# Patient Record
Sex: Female | Born: 1980 | Race: White | Hispanic: No | Marital: Married | Smoking: Never smoker
Health system: Southern US, Community
[De-identification: ages and names within clinical notes are randomized; demographics above are authoritative.]

## PROBLEM LIST (undated history)

## (undated) DIAGNOSIS — N809 Endometriosis, unspecified: Secondary | ICD-10-CM

## (undated) DIAGNOSIS — N3010 Interstitial cystitis (chronic) without hematuria: Secondary | ICD-10-CM

## (undated) DIAGNOSIS — I10 Essential (primary) hypertension: Secondary | ICD-10-CM

## (undated) HISTORY — PX: ABDOMINAL SURGERY: SHX537

## (undated) HISTORY — PX: TONSILLECTOMY: SUR1361

## (undated) HISTORY — DX: Interstitial cystitis (chronic) without hematuria: N30.10

## (undated) HISTORY — DX: Endometriosis, unspecified: N80.9

## (undated) HISTORY — PX: KNEE SURGERY: SHX244

---

## 2002-03-08 ENCOUNTER — Ambulatory Visit (HOSPITAL_COMMUNITY): Admission: RE | Admit: 2002-03-08 | Discharge: 2002-03-08 | Payer: Self-pay | Admitting: Internal Medicine

## 2002-03-08 ENCOUNTER — Encounter: Payer: Self-pay | Admitting: Internal Medicine

## 2002-04-23 ENCOUNTER — Other Ambulatory Visit: Admission: RE | Admit: 2002-04-23 | Discharge: 2002-04-23 | Payer: Self-pay | Admitting: Obstetrics and Gynecology

## 2002-12-22 ENCOUNTER — Emergency Department (HOSPITAL_COMMUNITY): Admission: EM | Admit: 2002-12-22 | Discharge: 2002-12-22 | Payer: Self-pay | Admitting: Emergency Medicine

## 2003-02-14 ENCOUNTER — Other Ambulatory Visit: Admission: RE | Admit: 2003-02-14 | Discharge: 2003-02-14 | Payer: Self-pay | Admitting: Obstetrics and Gynecology

## 2003-10-28 ENCOUNTER — Other Ambulatory Visit: Admission: RE | Admit: 2003-10-28 | Discharge: 2003-10-28 | Payer: Self-pay | Admitting: Obstetrics and Gynecology

## 2009-09-05 ENCOUNTER — Emergency Department (HOSPITAL_BASED_OUTPATIENT_CLINIC_OR_DEPARTMENT_OTHER): Admission: EM | Admit: 2009-09-05 | Discharge: 2009-09-05 | Payer: Self-pay | Admitting: Emergency Medicine

## 2010-11-11 ENCOUNTER — Emergency Department (HOSPITAL_BASED_OUTPATIENT_CLINIC_OR_DEPARTMENT_OTHER)
Admission: EM | Admit: 2010-11-11 | Discharge: 2010-11-11 | Disposition: A | Discharge status: Home / Self Care | Payer: BC Managed Care – PPO | Attending: Emergency Medicine | Admitting: Emergency Medicine

## 2010-11-11 DIAGNOSIS — R109 Unspecified abdominal pain: Secondary | ICD-10-CM | POA: Insufficient documentation

## 2010-11-11 DIAGNOSIS — N83209 Unspecified ovarian cyst, unspecified side: Secondary | ICD-10-CM | POA: Insufficient documentation

## 2010-11-11 DIAGNOSIS — I10 Essential (primary) hypertension: Secondary | ICD-10-CM | POA: Insufficient documentation

## 2010-11-11 LAB — DIFFERENTIAL
Basophils Absolute: 0 10*3/uL (ref 0.0–0.1)
Basophils Relative: 0 % (ref 0–1)
Eosinophils Absolute: 0.1 10*3/uL (ref 0.0–0.7)
Eosinophils Relative: 1 % (ref 0–5)
Lymphocytes Relative: 15 % (ref 12–46)
Lymphs Abs: 1.4 10*3/uL (ref 0.7–4.0)
Monocytes Absolute: 0.7 10*3/uL (ref 0.1–1.0)
Monocytes Relative: 8 % (ref 3–12)
Neutro Abs: 7.3 10*3/uL (ref 1.7–7.7)
Neutrophils Relative %: 77 % (ref 43–77)

## 2010-11-11 LAB — COMPREHENSIVE METABOLIC PANEL
ALT: 23 U/L (ref 0–35)
AST: 25 U/L (ref 0–37)
Albumin: 4.8 g/dL (ref 3.5–5.2)
Alkaline Phosphatase: 57 U/L (ref 39–117)
BUN: 16 mg/dL (ref 6–23)
CO2: 22 mEq/L (ref 19–32)
Calcium: 9.1 mg/dL (ref 8.4–10.5)
Chloride: 106 mEq/L (ref 96–112)
Creatinine, Ser: 0.7 mg/dL (ref 0.4–1.2)
GFR calc Af Amer: >60 mL/min (ref >60)
GFR calc non Af Amer: >60 mL/min (ref >60)
Glucose, Bld: 99 mg/dL (ref 70–99)
Potassium: 3.7 mEq/L (ref 3.5–5.1)
Sodium: 142 mEq/L (ref 135–145)
Total Bilirubin: 0.8 mg/dL (ref 0.3–1.2)
Total Protein: 8 g/dL (ref 6.0–8.3)

## 2010-11-11 LAB — URINALYSIS, ROUTINE W REFLEX MICROSCOPIC
Bilirubin Urine: NEGATIVE
Glucose, UA: NEGATIVE mg/dL
Hgb urine dipstick: NEGATIVE
Ketones, ur: NEGATIVE mg/dL
Nitrite: NEGATIVE
Protein, ur: NEGATIVE mg/dL
Specific Gravity, Urine: 1.012 (ref 1.005–1.030)
Urobilinogen, UA: 0.2 mg/dL (ref 0.0–1.0)
pH: 6.5 (ref 5.0–8.0)

## 2010-11-11 LAB — CBC
HCT: 38.8 % (ref 36.0–46.0)
Hemoglobin: 13.9 g/dL (ref 12.0–15.0)
MCH: 32.6 pg (ref 26.0–34.0)
MCHC: 35.8 g/dL (ref 30.0–36.0)
MCV: 91.1 fL (ref 78.0–100.0)
Platelets: 235 10*3/uL (ref 150–400)
RBC: 4.26 MIL/uL (ref 3.87–5.11)
RDW: 12.2 % (ref 11.5–15.5)
WBC: 9.5 10*3/uL (ref 4.0–10.5)

## 2010-11-11 LAB — PREGNANCY, URINE: Preg Test, Ur: POSITIVE

## 2010-11-14 LAB — URINE MICROSCOPIC-ADD ON

## 2010-11-14 LAB — URINALYSIS, ROUTINE W REFLEX MICROSCOPIC
Bilirubin Urine: NEGATIVE
Glucose, UA: NEGATIVE mg/dL
Ketones, ur: NEGATIVE mg/dL
Leukocytes, UA: NEGATIVE
Nitrite: NEGATIVE
Protein, ur: NEGATIVE mg/dL
Specific Gravity, Urine: 1.017 (ref 1.005–1.030)
Urobilinogen, UA: 1 mg/dL (ref 0.0–1.0)
pH: 7 (ref 5.0–8.0)

## 2010-11-14 LAB — COMPREHENSIVE METABOLIC PANEL
ALT: 17 U/L (ref 0–35)
AST: 22 U/L (ref 0–37)
Albumin: 4.6 g/dL (ref 3.5–5.2)
Alkaline Phosphatase: 70 U/L (ref 39–117)
BUN: 18 mg/dL (ref 6–23)
CO2: 25 mEq/L (ref 19–32)
Calcium: 9 mg/dL (ref 8.4–10.5)
Chloride: 106 mEq/L (ref 96–112)
Creatinine, Ser: 0.7 mg/dL (ref 0.4–1.2)
GFR calc Af Amer: >60 mL/min (ref >60)
GFR calc non Af Amer: >60 mL/min (ref >60)
Glucose, Bld: 98 mg/dL (ref 70–99)
Potassium: 3.4 mEq/L — ABNORMAL LOW (ref 3.5–5.1)
Sodium: 142 mEq/L (ref 135–145)
Total Bilirubin: 0.8 mg/dL (ref 0.3–1.2)
Total Protein: 7.7 g/dL (ref 6.0–8.3)

## 2010-11-14 LAB — DIFFERENTIAL
Basophils Absolute: 0.1 10*3/uL (ref 0.0–0.1)
Basophils Relative: 1 % (ref 0–1)
Eosinophils Absolute: 0 10*3/uL (ref 0.0–0.7)
Eosinophils Relative: 0 % (ref 0–5)
Lymphocytes Relative: 4 % — ABNORMAL LOW (ref 12–46)
Lymphs Abs: 0.4 10*3/uL — ABNORMAL LOW (ref 0.7–4.0)
Monocytes Absolute: 0.3 10*3/uL (ref 0.1–1.0)
Monocytes Relative: 3 % (ref 3–12)
Neutro Abs: 9.9 10*3/uL — ABNORMAL HIGH (ref 1.7–7.7)
Neutrophils Relative %: 93 % — ABNORMAL HIGH (ref 43–77)

## 2010-11-14 LAB — CBC
HCT: 44.7 % (ref 36.0–46.0)
Hemoglobin: 15.5 g/dL — ABNORMAL HIGH (ref 12.0–15.0)
MCHC: 34.7 g/dL (ref 30.0–36.0)
MCV: 95.6 fL (ref 78.0–100.0)
Platelets: 220 10*3/uL (ref 150–400)
RBC: 4.68 MIL/uL (ref 3.87–5.11)
RDW: 12.2 % (ref 11.5–15.5)
WBC: 10.7 10*3/uL — ABNORMAL HIGH (ref 4.0–10.5)

## 2010-11-14 LAB — PREGNANCY, URINE: Preg Test, Ur: NEGATIVE

## 2010-11-14 LAB — LIPASE, BLOOD: Lipase: 42 U/L (ref 23–300)

## 2010-12-01 ENCOUNTER — Emergency Department (HOSPITAL_BASED_OUTPATIENT_CLINIC_OR_DEPARTMENT_OTHER)
Admission: EM | Admit: 2010-12-01 | Discharge: 2010-12-01 | Disposition: A | Discharge status: Other Acute Inpatient Hospital | Payer: BC Managed Care – PPO | Attending: Emergency Medicine | Admitting: Emergency Medicine

## 2010-12-01 DIAGNOSIS — O2690 Pregnancy related conditions, unspecified, unspecified trimester: Secondary | ICD-10-CM | POA: Insufficient documentation

## 2010-12-01 DIAGNOSIS — R1031 Right lower quadrant pain: Secondary | ICD-10-CM | POA: Insufficient documentation

## 2010-12-01 DIAGNOSIS — I10 Essential (primary) hypertension: Secondary | ICD-10-CM | POA: Insufficient documentation

## 2010-12-01 LAB — URINALYSIS, ROUTINE W REFLEX MICROSCOPIC
Bilirubin Urine: NEGATIVE
Glucose, UA: NEGATIVE mg/dL
Hgb urine dipstick: NEGATIVE
Ketones, ur: NEGATIVE mg/dL
Nitrite: NEGATIVE
Protein, ur: NEGATIVE mg/dL
Specific Gravity, Urine: 1.014 (ref 1.005–1.030)
Urobilinogen, UA: 0.2 mg/dL (ref 0.0–1.0)
pH: 7 (ref 5.0–8.0)

## 2011-04-27 ENCOUNTER — Other Ambulatory Visit (HOSPITAL_COMMUNITY): Payer: Self-pay | Admitting: Obstetrics and Gynecology

## 2011-04-27 DIAGNOSIS — N979 Female infertility, unspecified: Secondary | ICD-10-CM

## 2011-04-29 ENCOUNTER — Ambulatory Visit (HOSPITAL_COMMUNITY)
Admission: RE | Admit: 2011-04-29 | Discharge: 2011-04-29 | Disposition: A | Discharge status: Home / Self Care | Payer: BC Managed Care – PPO | Source: Ambulatory Visit | Attending: Obstetrics and Gynecology | Admitting: Obstetrics and Gynecology

## 2011-04-29 DIAGNOSIS — O00109 Unspecified tubal pregnancy without intrauterine pregnancy: Secondary | ICD-10-CM

## 2011-04-29 DIAGNOSIS — N979 Female infertility, unspecified: Secondary | ICD-10-CM

## 2011-04-29 DIAGNOSIS — N96 Recurrent pregnancy loss: Secondary | ICD-10-CM | POA: Insufficient documentation

## 2011-04-29 MED ORDER — IOHEXOL 300 MG/ML  SOLN
8.0000 mL | Freq: Once | INTRAMUSCULAR | Status: AC | PRN
Start: 2011-04-29 — End: 2011-04-29
  Administered 2011-04-29: 8 mL

## 2011-07-19 DIAGNOSIS — I10 Essential (primary) hypertension: Secondary | ICD-10-CM | POA: Insufficient documentation

## 2011-12-16 DIAGNOSIS — N979 Female infertility, unspecified: Secondary | ICD-10-CM | POA: Insufficient documentation

## 2013-02-19 ENCOUNTER — Encounter (HOSPITAL_BASED_OUTPATIENT_CLINIC_OR_DEPARTMENT_OTHER): Payer: Self-pay | Admitting: *Deleted

## 2013-02-19 ENCOUNTER — Emergency Department (HOSPITAL_BASED_OUTPATIENT_CLINIC_OR_DEPARTMENT_OTHER)
Admission: EM | Admit: 2013-02-19 | Discharge: 2013-02-19 | Disposition: A | Discharge status: Home / Self Care | Payer: BC Managed Care – PPO | Attending: Emergency Medicine | Admitting: Emergency Medicine

## 2013-02-19 DIAGNOSIS — L299 Pruritus, unspecified: Secondary | ICD-10-CM | POA: Insufficient documentation

## 2013-02-19 DIAGNOSIS — L272 Dermatitis due to ingested food: Secondary | ICD-10-CM

## 2013-02-19 DIAGNOSIS — R21 Rash and other nonspecific skin eruption: Secondary | ICD-10-CM | POA: Insufficient documentation

## 2013-02-19 DIAGNOSIS — Z79899 Other long term (current) drug therapy: Secondary | ICD-10-CM | POA: Insufficient documentation

## 2013-02-19 DIAGNOSIS — Y9389 Activity, other specified: Secondary | ICD-10-CM | POA: Insufficient documentation

## 2013-02-19 DIAGNOSIS — T61771A Other fish poisoning, accidental (unintentional), initial encounter: Secondary | ICD-10-CM | POA: Insufficient documentation

## 2013-02-19 DIAGNOSIS — I10 Essential (primary) hypertension: Secondary | ICD-10-CM | POA: Insufficient documentation

## 2013-02-19 DIAGNOSIS — Y9289 Other specified places as the place of occurrence of the external cause: Secondary | ICD-10-CM | POA: Insufficient documentation

## 2013-02-19 HISTORY — DX: Essential (primary) hypertension: I10

## 2013-02-19 MED ORDER — FAMOTIDINE IN NACL 20-0.9 MG/50ML-% IV SOLN
20.0000 mg | Freq: Once | INTRAVENOUS | Status: AC
  Administered 2013-02-19: 20 mg via INTRAVENOUS
  Filled 2013-02-19: qty 50

## 2013-02-19 MED ORDER — DIPHENHYDRAMINE HCL 50 MG/ML IJ SOLN
25.0000 mg | Freq: Once | INTRAMUSCULAR | Status: AC
  Administered 2013-02-19: 25 mg via INTRAVENOUS
  Filled 2013-02-19: qty 1

## 2013-02-19 MED ORDER — EPINEPHRINE 0.3 MG/0.3ML IJ SOAJ: 0.3000 mg | INTRAMUSCULAR | Status: DC | PRN

## 2013-02-19 MED ORDER — ACETAMINOPHEN 325 MG PO TABS
650.0000 mg | ORAL_TABLET | Freq: Once | ORAL | Status: AC
  Administered 2013-02-19: 650 mg via ORAL
  Filled 2013-02-19: qty 2

## 2013-02-19 MED ORDER — SODIUM CHLORIDE 0.9 % IV SOLN
Freq: Once | INTRAVENOUS | Status: AC
  Administered 2013-02-19: 1000 mL via INTRAVENOUS

## 2013-02-19 MED ORDER — METHYLPREDNISOLONE SODIUM SUCC 125 MG IJ SOLR
125.0000 mg | Freq: Once | INTRAMUSCULAR | Status: AC
  Administered 2013-02-19: 125 mg via INTRAVENOUS
  Filled 2013-02-19: qty 2

## 2013-02-19 NOTE — ED Provider Notes (Signed)
Medical screening examination/treatment/procedure(s) were performed by non-physician practitioner and as supervising physician I was immediately available for consultation/collaboration.   Ebunoluwa Gernert, MD 02/19/13 2322 

## 2013-02-19 NOTE — ED Notes (Signed)
MD at bedside. 

## 2013-02-19 NOTE — ED Notes (Signed)
Pt was eating tuna at a family member's that had unknown spices and she had a sudden onset of HA, skin flushing, chest tightness and sob. Pt denies difficulty swallowing.

## 2013-02-19 NOTE — ED Notes (Signed)
Redness on skin already resolving, only noticeable on trunk at this time.  Face completely cleared.

## 2013-02-19 NOTE — ED Provider Notes (Signed)
History    CSN: [id number] Arrival date & time 02/19/13  2014  None    Chief Complaint  Patient presents with  . Allergic Reaction   (Consider location/radiation/quality/duration/timing/severity/associated sxs/prior Treatment) Patient is a 32 y.o. female presenting with allergic reaction. The history is provided by the patient. No language interpreter was used.  Allergic Reaction Presenting symptoms: itching and rash   Presenting symptoms: no difficulty breathing, no difficulty swallowing and no swelling   Itching:    Location:  Full body   Severity:  Moderate Rash:    Location:  Full body Severity:  Moderate Prior allergic episodes:  No prior episodes Context: food   Relieved by:  Nothing Worsened by:  Nothing tried Ineffective treatments:  None tried Pt ate tuna with sesame seeds and spices.   Pt reports skin flushing, redness,  Pt reports mouth felt tingly Past Medical History  Diagnosis Date  . Hypertension    Past Surgical History  Procedure Laterality Date  . Abdominal surgery    . Tonsillectomy     No family history on file. History  Substance Use Topics  . Smoking status: Never Smoker   . Smokeless tobacco: Not on file  . Alcohol Use: Yes   OB History   Grav Para Term Preterm Abortions TAB SAB Ect Mult Living                 Review of Systems  HENT: Negative for trouble swallowing.   Skin: Positive for itching and rash.  All other systems reviewed and are negative.    Allergies  Erythromycin  Home Medications   Current Outpatient Rx  Name  Route  Sig  Dispense  Refill  . amLODipine (NORVASC) 2.5 MG tablet   Oral   Take 2.5 mg by mouth 2 (two) times daily.         . carvedilol (COREG) 3.125 MG tablet   Oral   Take 3.125 mg by mouth 2 (two) times daily with a meal.         . methyldopa (ALDOMET) 250 MG tablet   Oral   Take 250 mg by mouth 2 (two) times daily.          BP 111/57  Pulse 115  Temp(Src) 97.3 F (36.3 C) (Oral)   Resp 18  Ht 5\' 7"  (1.702 m)  Wt 135 lb (61.236 kg)  BMI 21.14 kg/m2  SpO2 100%  LMP 01/23/2013 Physical Exam  Nursing note and vitals reviewed. Constitutional: She appears well-developed and well-nourished.  HENT:  Head: Normocephalic and atraumatic.  Eyes: Conjunctivae and EOM are normal. Pupils are equal, round, and reactive to light.  Neck: Normal range of motion. Neck supple.  Cardiovascular: Normal rate and normal heart sounds.   Pulmonary/Chest: Effort normal and breath sounds normal.  Abdominal: Soft. Bowel sounds are normal.  Musculoskeletal: Normal range of motion.  Neurological: She is alert.  Skin: Rash noted. There is erythema.  Psychiatric: She has a normal mood and affect.    ED Course  Procedures (including critical care time) Labs Reviewed - No data to display No results found. 1. Dermatitis due to allergic reaction to food     MDM  Iv ns pepcid, solumedrol, benadryl.     Pt given rx for epipen.   Pt advised to see Dr.  Callas for allergy testing.   Strict avoidance of seafood, sesame Pt reevaluated,  No rash,  No shortness of breath.   Pt feels much better.  Elson Areas, PA-C 02/19/13 2223  Elson Areas, PA-C 02/19/13 2234  Lonia [place], PA-C 02/19/13 2235

## 2013-08-21 ENCOUNTER — Emergency Department (HOSPITAL_BASED_OUTPATIENT_CLINIC_OR_DEPARTMENT_OTHER): Payer: BC Managed Care – PPO

## 2013-08-21 ENCOUNTER — Encounter (HOSPITAL_BASED_OUTPATIENT_CLINIC_OR_DEPARTMENT_OTHER): Payer: Self-pay | Admitting: Emergency Medicine

## 2013-08-21 ENCOUNTER — Emergency Department (HOSPITAL_BASED_OUTPATIENT_CLINIC_OR_DEPARTMENT_OTHER)
Admission: EM | Admit: 2013-08-21 | Discharge: 2013-08-21 | Disposition: A | Discharge status: Home / Self Care | Payer: BC Managed Care – PPO | Attending: Emergency Medicine | Admitting: Emergency Medicine

## 2013-08-21 DIAGNOSIS — S93401A Sprain of unspecified ligament of right ankle, initial encounter: Secondary | ICD-10-CM

## 2013-08-21 DIAGNOSIS — S93409A Sprain of unspecified ligament of unspecified ankle, initial encounter: Secondary | ICD-10-CM | POA: Insufficient documentation

## 2013-08-21 DIAGNOSIS — Y929 Unspecified place or not applicable: Secondary | ICD-10-CM | POA: Insufficient documentation

## 2013-08-21 DIAGNOSIS — Z79899 Other long term (current) drug therapy: Secondary | ICD-10-CM | POA: Insufficient documentation

## 2013-08-21 DIAGNOSIS — Y9301 Activity, walking, marching and hiking: Secondary | ICD-10-CM | POA: Insufficient documentation

## 2013-08-21 DIAGNOSIS — X500XXA Overexertion from strenuous movement or load, initial encounter: Secondary | ICD-10-CM | POA: Insufficient documentation

## 2013-08-21 DIAGNOSIS — I10 Essential (primary) hypertension: Secondary | ICD-10-CM | POA: Insufficient documentation

## 2013-08-21 MED ORDER — HYDROCODONE-ACETAMINOPHEN 5-325 MG PO TABS: 2.0000 | ORAL_TABLET | ORAL | Status: DC | PRN

## 2013-08-21 NOTE — ED Notes (Signed)
Right ankle injury.  Pt stumbled and twisted ankle.  Good pulse, movement and color.

## 2013-08-21 NOTE — ED Provider Notes (Signed)
Medical screening examination/treatment/procedure(s) were performed by non-physician practitioner and as supervising physician I was immediately available for consultation/collaboration.  EKG Interpretation   None        Doug Sou, MD 08/21/13 [phone]

## 2013-08-21 NOTE — ED Provider Notes (Signed)
CSN: [id number]     Arrival date & time 08/21/13  1327 History   None    Chief Complaint  Patient presents with  . Ankle Injury   (Consider location/radiation/quality/duration/timing/severity/associated sxs/prior Treatment) Patient is a 32 y.o. female presenting with ankle pain. The history is provided by the patient. No language interpreter was used.  Ankle Pain Location:  Ankle Injury: yes   Ankle location:  R ankle Pain details:    Quality:  Aching   Radiates to:  Does not radiate   Severity:  Moderate   Timing:  Constant   Progression:  Worsening Chronicity:  New Foreign body present:  No foreign bodies Prior injury to area:  No Ineffective treatments:  None tried Pt turned her ankle while walking.   Pt complains of swelling and pain  Past Medical History  Diagnosis Date  . Hypertension    Past Surgical History  Procedure Laterality Date  . Abdominal surgery    . Tonsillectomy    . Knee surgery     No family history on file. History  Substance Use Topics  . Smoking status: Never Smoker   . Smokeless tobacco: Not on file  . Alcohol Use: Yes   OB History   Grav Para Term Preterm Abortions TAB SAB Ect Mult Living                 Review of Systems  Musculoskeletal: Positive for joint swelling and myalgias.  All other systems reviewed and are negative.    Allergies  Erythromycin  Home Medications   Current Outpatient Rx  Name  Route  Sig  Dispense  Refill  . gabapentin (NEURONTIN) 300 MG capsule   Oral   Take 300 mg by mouth 3 (three) times daily.         Marland Kitchen amLODipine (NORVASC) 2.5 MG tablet   Oral   Take 2.5 mg by mouth 2 (two) times daily.         . carvedilol (COREG) 3.125 MG tablet   Oral   Take 3.125 mg by mouth 2 (two) times daily with a meal.         . EPINEPHrine (EPIPEN) 0.3 mg/0.3 mL DEVI   Intramuscular   Inject 0.3 mLs (0.3 mg total) into the muscle as needed.   1 Device   1   . methyldopa (ALDOMET) 250 MG tablet    Oral   Take 250 mg by mouth 2 (two) times daily.          BP 133/81  Pulse 61  Temp(Src) 98.5 F (36.9 C) (Oral)  Resp 16  Ht 5\' 7"  (1.702 m)  Wt 140 lb (63.504 kg)  BMI 21.92 kg/m2  SpO2 100%  LMP 08/09/2013 Physical Exam  Nursing note and vitals reviewed. Constitutional: She is oriented to person, place, and time. She appears well-developed and well-nourished.  HENT:  Head: Normocephalic and atraumatic.  Musculoskeletal: She exhibits tenderness.  Swollen tender right ankle, decreased range of motion,  nv and ns intact   Neurological: She is alert and oriented to person, place, and time. She has normal reflexes.  Skin: Skin is warm.  Psychiatric: She has a normal mood and affect.    ED Course  Procedures (including critical care time) Labs Review Labs Reviewed - No data to display Imaging Review Dg Ankle Complete Right  08/21/2013   CLINICAL DATA:  Ankle gave out while walking down stairs, pain all over, swelling medially and laterally  EXAM: RIGHT ANKLE -  COMPLETE 3+ VIEW  COMPARISON:  None  FINDINGS: Scattered soft tissue swelling most prominent laterally.  Osseous mineralization normal.  Ankle mortise intact.  No acute fracture, dislocation, or bone destruction.  IMPRESSION: No acute osseous abnormalities.   Electronically Signed   By: Ulyses Southward M.D.   On: 08/21/2013 13:49    EKG Interpretation   None       MDM   1. Right ankle sprain, initial encounter    [place], crutches and hydrocodone,  Follow up with Dr. Pearletha Forge for recheck in 1 week    Elson Areas, PA-C 08/21/13 1520

## 2013-08-21 NOTE — ED Notes (Signed)
PA at bedside.

## 2015-09-04 NOTE — Patient Instructions (Addendum)
Your procedure is scheduled on:  Thursday, Jan. 12, 2017  Enter through the Main Entrance of [facility] at:  6:00 A.M.  Pick up the phone at the desk and dial [phone].  Call this number if you have problems the morning of surgery: [phone].  Remember: Do NOT eat food or drink after:  Midnight Wednesday Take these medicines the morning of surgery with a SIP OF WATER:  Amlodipine, Carvedilol,  Methyldopa  *Bring Asthma inhaler day of surgery  Do NOT wear jewelry (body piercing), metal hair clips/bobby pins, make-up, or nail polish. Do NOT wear lotions, powders, or perfumes.  You may wear deoderant. Do NOT shave for 48 hours prior to surgery. Do NOT bring valuables to the hospital. Contacts, dentures, or bridgework may not be worn into surgery.  Have a responsible adult drive you home and stay with you for 24 hours after your procedure

## 2015-09-07 ENCOUNTER — Inpatient Hospital Stay (HOSPITAL_COMMUNITY)
Admission: RE | Admit: 2015-09-07 | Discharge: 2015-09-07 | Disposition: A | Discharge status: Home / Self Care | Payer: Self-pay | Source: Ambulatory Visit

## 2015-09-10 ENCOUNTER — Encounter (HOSPITAL_COMMUNITY): Admission: RE | Payer: Self-pay | Source: Ambulatory Visit

## 2015-09-10 ENCOUNTER — Ambulatory Visit (HOSPITAL_COMMUNITY)
Admission: RE | Admit: 2015-09-10 | Payer: BC Managed Care – PPO | Source: Ambulatory Visit | Admitting: Obstetrics and Gynecology

## 2015-09-10 SURGERY — LAPAROTOMY, FOR LYSIS OF ADHESIONS
Anesthesia: General | Laterality: Right

## 2015-09-11 DIAGNOSIS — J45909 Unspecified asthma, uncomplicated: Secondary | ICD-10-CM | POA: Insufficient documentation

## 2015-10-13 DIAGNOSIS — R768 Other specified abnormal immunological findings in serum: Secondary | ICD-10-CM | POA: Insufficient documentation

## 2015-10-13 DIAGNOSIS — R7689 Other specified abnormal immunological findings in serum: Secondary | ICD-10-CM | POA: Insufficient documentation

## 2016-06-14 DIAGNOSIS — N736 Female pelvic peritoneal adhesions (postinfective): Secondary | ICD-10-CM | POA: Insufficient documentation

## 2016-06-14 DIAGNOSIS — N809 Endometriosis, unspecified: Secondary | ICD-10-CM | POA: Insufficient documentation

## 2016-08-29 DIAGNOSIS — N3010 Interstitial cystitis (chronic) without hematuria: Secondary | ICD-10-CM

## 2016-08-29 HISTORY — DX: Interstitial cystitis (chronic) without hematuria: N30.10

## 2017-06-22 DIAGNOSIS — R3982 Chronic bladder pain: Secondary | ICD-10-CM | POA: Insufficient documentation

## 2017-09-22 DIAGNOSIS — M533 Sacrococcygeal disorders, not elsewhere classified: Secondary | ICD-10-CM | POA: Insufficient documentation

## 2018-04-27 ENCOUNTER — Emergency Department (HOSPITAL_BASED_OUTPATIENT_CLINIC_OR_DEPARTMENT_OTHER)
Admission: EM | Admit: 2018-04-27 | Discharge: 2018-04-27 | Disposition: A | Discharge status: Home / Self Care | Payer: No Typology Code available for payment source | Attending: Emergency Medicine | Admitting: Emergency Medicine

## 2018-04-27 ENCOUNTER — Other Ambulatory Visit: Payer: Self-pay

## 2018-04-27 ENCOUNTER — Emergency Department (HOSPITAL_BASED_OUTPATIENT_CLINIC_OR_DEPARTMENT_OTHER): Payer: No Typology Code available for payment source

## 2018-04-27 ENCOUNTER — Encounter (HOSPITAL_BASED_OUTPATIENT_CLINIC_OR_DEPARTMENT_OTHER): Payer: Self-pay

## 2018-04-27 DIAGNOSIS — I10 Essential (primary) hypertension: Secondary | ICD-10-CM | POA: Insufficient documentation

## 2018-04-27 DIAGNOSIS — Z79899 Other long term (current) drug therapy: Secondary | ICD-10-CM | POA: Insufficient documentation

## 2018-04-27 DIAGNOSIS — S67192A Crushing injury of right middle finger, initial encounter: Secondary | ICD-10-CM | POA: Insufficient documentation

## 2018-04-27 DIAGNOSIS — S67190A Crushing injury of right index finger, initial encounter: Secondary | ICD-10-CM | POA: Diagnosis not present

## 2018-04-27 DIAGNOSIS — S6710XA Crushing injury of unspecified finger(s), initial encounter: Secondary | ICD-10-CM

## 2018-04-27 DIAGNOSIS — Y929 Unspecified place or not applicable: Secondary | ICD-10-CM | POA: Diagnosis not present

## 2018-04-27 DIAGNOSIS — T1490XA Injury, unspecified, initial encounter: Secondary | ICD-10-CM

## 2018-04-27 DIAGNOSIS — W231XXA Caught, crushed, jammed, or pinched between stationary objects, initial encounter: Secondary | ICD-10-CM | POA: Insufficient documentation

## 2018-04-27 DIAGNOSIS — Y990 Civilian activity done for income or pay: Secondary | ICD-10-CM | POA: Diagnosis not present

## 2018-04-27 DIAGNOSIS — Y939 Activity, unspecified: Secondary | ICD-10-CM | POA: Diagnosis not present

## 2018-04-27 NOTE — Discharge Instructions (Addendum)
You were evaluated in the emergency department for crush injuries to your right index and middle finger.  Your x-rays did not show an obvious fracture.  This will usually improve with ice Tylenol ibuprofen as needed.  Please follow-up with your doctor if any concerns.

## 2018-04-27 NOTE — ED Provider Notes (Signed)
[facility] EMERGENCY DEPARTMENT Provider Note   CSN: [id number] Arrival date & time: 04/27/18  1547     History   Chief Complaint Chief Complaint  Patient presents with  . Finger Injury    HPI Rebekah Hanson is a 37 y.o. female.  She is right-hand dominant.  She was injured at work with a crush to her right index and middle finger when she got heavy cart pinned up against a door.  This happened earlier today and she has been icing them with some relief pain and swelling.  There was no bleeding.  She is got no numbness.  Denies other injury or complaints.  The history is provided by the patient.  Hand Pain  This is a new problem. The current episode started 3 to 5 hours ago. The problem occurs constantly. The problem has been gradually improving. Pertinent negatives include no chest pain, no abdominal pain, no headaches and no shortness of breath. The symptoms are aggravated by bending. The symptoms are relieved by ice. She has tried a cold compress for the symptoms. The treatment provided moderate relief.    Past Medical History:  Diagnosis Date  . Hypertension     There are no active problems to display for this patient.   Past Surgical History:  Procedure Laterality Date  . ABDOMINAL SURGERY    . KNEE SURGERY    . TONSILLECTOMY       OB History   None      Home Medications    Prior to Admission medications   Medication Sig Start Date End Date Taking? Authorizing Provider  acetaminophen (TYLENOL ARTHRITIS PAIN) 650 MG CR tablet Take 1,300 mg by mouth every 8 (eight) hours as needed for pain.    [provider]  albuterol (PROVENTIL HFA;VENTOLIN HFA) 108 (90 Base) MCG/ACT inhaler Inhale 2 puffs into the lungs every 6 (six) hours as needed for wheezing or shortness of breath.    [provider]  amLODipine (NORVASC) 2.5 MG tablet Take 2.5 mg by mouth 2 (two) times daily.    [provider]  carvedilol (COREG) 3.125 MG  tablet Take 3.125 mg by mouth 2 (two) times daily with a meal.    [provider]  EPINEPHrine (EPIPEN) 0.3 mg/0.3 mL DEVI Inject 0.3 mLs (0.3 mg total) into the muscle as needed. 02/19/13   Elson Areas, PA-C  Fluticasone Furoate-Vilanterol (BREO ELLIPTA) 100-25 MCG/INH AEPB Inhale 1 puff into the lungs daily.    [provider]  guaiFENesin (MUCINEX) 600 MG 12 hr tablet Take 600 mg by mouth daily as needed for cough or to loosen phlegm.    [provider]  methyldopa (ALDOMET) 250 MG tablet Take 250 mg by mouth 2 (two) times daily.    [provider]  montelukast (SINGULAIR) 10 MG tablet Take 10 mg by mouth at bedtime.    [provider]  naproxen sodium (ANAPROX) 220 MG tablet Take 440 mg by mouth 2 (two) times daily with a meal.     [provider]    Family History No family history on file.  Social History Social History   Tobacco Use  . Smoking status: Never Smoker  Substance Use Topics  . Alcohol use: Yes  . Drug use: Not on file     Allergies   Amoxicillin and Erythromycin   Review of Systems Review of Systems  Constitutional: Negative for fever.  HENT: Negative for sore throat.   Eyes: Negative  for visual disturbance.  Respiratory: Negative for shortness of breath.   Cardiovascular: Negative for chest pain.  Gastrointestinal: Negative for abdominal pain.  Genitourinary: Negative for dysuria.  Musculoskeletal: Negative for back pain.  Skin: Negative for rash and wound.  Neurological: Negative for headaches.     Physical Exam Updated Vital Signs BP (!) 134/94 (BP Location: Left Arm)   Pulse 78   Temp 98.5 F (36.9 C) (Oral)   Resp 16   Ht 5\' 7"  (1.702 m)   Wt 63.5 kg   LMP 04/20/2018   SpO2 100%   BMI 21.93 kg/m   Physical Exam  Constitutional: She appears well-developed and well-nourished.  HENT:  Head: Normocephalic and atraumatic.  Eyes: Conjunctivae are normal.  Neck: Neck supple.    Pulmonary/Chest: Effort normal.  Musculoskeletal: She exhibits tenderness. She exhibits no deformity.  She is some erythema and some tenderness over her right index at the dIP and her right middle at the DIP.  Nails are intact and there is no subungual hematoma.  Sensation intact to light touch.  No skin breaks.  Other digits unaffected full range of motion.  Neurological: She is alert. GCS eye subscore is 4. GCS verbal subscore is 5. GCS motor subscore is 6.  Skin: Skin is warm and dry. Capillary refill takes less than 2 seconds.  Psychiatric: She has a normal mood and affect.     ED Treatments / Results  Labs (all labs ordered are listed, but only abnormal results are displayed) Labs Reviewed - No data to display  EKG None  Radiology Dg Hand Complete Right  Result Date: 04/27/2018 CLINICAL DATA:  Second third digit pain after hand was caught between a book cart and door frame. EXAM: RIGHT HAND - COMPLETE 3+ VIEW COMPARISON:  None. FINDINGS: There is no evidence of fracture or dislocation. There is no evidence of arthropathy or other focal bone abnormality. Soft tissues are unremarkable. IMPRESSION: Negative. Electronically Signed   By: Tollie Ethavid  Kwon M.D.   On: 04/27/2018 16:51    Procedures Procedures (including critical care time)  Medications Ordered in ED Medications - No data to display   Initial Impression / Assessment and Plan / ED Course  I have reviewed the triage vital signs and the nursing notes.  Pertinent labs & imaging results that were available during my care of the patient were reviewed by me and considered in my medical decision making (see chart for details).       Final Clinical Impressions(s) / ED Diagnoses   Final diagnoses:  Crushing injury of finger, initial encounter    ED Discharge Orders    None       Terrilee FilesButler, Billy Turvey C, MD 04/28/18 [phone]

## 2018-04-27 NOTE — ED Triage Notes (Addendum)
Pt got her right index and right middle fingers stuck between a wall and a heavy metal cart while moving some books at work, no UDS needed, no break in skin, pt has ice pack in place

## 2019-01-04 DIAGNOSIS — S92919A Unspecified fracture of unspecified toe(s), initial encounter for closed fracture: Secondary | ICD-10-CM | POA: Insufficient documentation

## 2019-05-10 DIAGNOSIS — Z8249 Family history of ischemic heart disease and other diseases of the circulatory system: Secondary | ICD-10-CM | POA: Insufficient documentation

## 2019-10-24 ENCOUNTER — Ambulatory Visit: Payer: BC Managed Care – PPO | Attending: Internal Medicine

## 2019-10-24 DIAGNOSIS — Z23 Encounter for immunization: Secondary | ICD-10-CM

## 2019-10-24 NOTE — Progress Notes (Signed)
   Covid-19 Vaccination Clinic  Name:  JAMYE BALICKI    MRN: [id number] DOB: 05-21-1981  10/24/2019  Ms. Mazzeo was observed post Covid-19 immunization for 15 minutes without incidence. She was provided with Vaccine Information Sheet and instruction to access the V-Safe system.   Ms. Landry was instructed to call 911 with any severe reactions post vaccine: Marland Kitchen Difficulty breathing  . Swelling of your face and throat  . A fast heartbeat  . A bad rash all over your body  . Dizziness and weakness    Immunizations Administered    Name Date Dose VIS Date Route   Pfizer COVID-19 Vaccine 10/24/2019  8:41 AM 0.3 mL 08/09/2019 Intramuscular   Manufacturer: [employer], [employer]   Lot: [id number]   NDC: [address]-9

## 2019-11-13 ENCOUNTER — Ambulatory Visit: Payer: BC Managed Care – PPO | Attending: Internal Medicine

## 2019-11-13 DIAGNOSIS — Z23 Encounter for immunization: Secondary | ICD-10-CM

## 2019-11-13 NOTE — Progress Notes (Signed)
   Covid-19 Vaccination Clinic  Name:  KAMIKA GOODLOE    MRN: [id number] DOB: July 25, 1981  11/13/2019  Ms. Spellman was observed post Covid-19 immunization for 15 minutes without incident. She was provided with Vaccine Information Sheet and instruction to access the V-Safe system.   Ms. Hollinger was instructed to call 911 with any severe reactions post vaccine: Marland Kitchen Difficulty breathing  . Swelling of face and throat  . A fast heartbeat  . A bad rash all over body  . Dizziness and weakness   Immunizations Administered    Name Date Dose VIS Date Route   Pfizer COVID-19 Vaccine 11/13/2019 11:11 AM 0.3 mL 08/09/2019 Intramuscular   Manufacturer: [employer], [employer]   Lot: [id number]   NDC: [address]-8

## 2019-12-20 ENCOUNTER — Other Ambulatory Visit: Payer: Self-pay

## 2019-12-20 ENCOUNTER — Ambulatory Visit (INDEPENDENT_AMBULATORY_CARE_PROVIDER_SITE_OTHER): Payer: BC Managed Care – PPO | Admitting: Family Medicine

## 2019-12-20 ENCOUNTER — Encounter: Payer: Self-pay | Admitting: Family Medicine

## 2019-12-20 DIAGNOSIS — G8929 Other chronic pain: Secondary | ICD-10-CM | POA: Diagnosis not present

## 2019-12-20 DIAGNOSIS — M545 Low back pain, unspecified: Secondary | ICD-10-CM

## 2019-12-20 DIAGNOSIS — M25552 Pain in left hip: Secondary | ICD-10-CM

## 2019-12-20 NOTE — Progress Notes (Signed)
Office Visit Note   Patient: Rebekah Hanson           Date of Birth: 11/22/1980           MRN: [id number] Visit Date: 12/20/2019 Requested by: Brooke Bonito, MD No address on file PCP: Brooke Bonito, MD  Subjective: Chief Complaint  Patient presents with  . Lower Back - Pain    Pain posterior hip/buttock, radiating down the posterior leg to bottom of foot - x 7 years. This started after vaginal birth of twins. Been seeing [place] for PT - recommended she come here. H/o chiro, ESIs, SI injection & piriformis injection.    HPI: She is here at the request of [place]mm[place] for left low back and hip pain.  7 years ago she gave birth to twin boys.  It was a high risk vaginal delivery so it was done in the operating room under anesthesia in anticipation of possible C-section.  She was not able to move or feel her legs during the deliveries and they had to help lift her so that she could push.  She did not notice any problems during the deliveries, but as soon as she was able to start walking after the anesthesia wore off, she felt immediate pain in the left posterior hip radiating down the left leg.  The pain continued over the next several weeks causing her to walk with a limp.  She began seeing a chiropractor but the treatments worked making the pain go away.  She then started working with [place] for physical therapy.  Therapy sessions helped, but only temporarily.  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  She had epidural steroid injection and sacroiliac injection but neither one helped.  2 years ago she went to [facility] and had an MRI of the pelvis which was unrevealing.  She was given a piriformis injection which helped for a couple months.  Her pain then came back and she has been dealing with it with physical therapy treatments.  Last year she became  pregnant with her daughter and during the second trimester, she became pain-free for some reason.  She had no troubles at all for the rest of her pregnancy until about 2 or 3 weeks after her C-section.  Now she is having daily pain with intermittent flareups as before.  She is very frustrated by her ongoing symptoms.  She is still working with [place] for physical therapy.  She is a former Personnel officer.  She has never had problems like this before.              ROS: No bowel or bladder dysfunction.  All other systems were reviewed and are negative.  Objective: Vital Signs: There were no vitals taken for this visit.  Physical Exam:  General:  Alert and oriented, in no acute distress. Pulm:  Breathing unlabored. Psy:  Normal mood, congruent affect.  Low back: She is tender to palpation near the SI joint on the left.  She is also tender in the gluteus medius region and along the piriformis muscle.  Straight leg raise is negative, lower extremity strength and reflexes are normal.  She has intermittent popping in the hip with passive circumduction.  She has good range of motion of the hip with no significant pain.  No pain with resisted strength testing.  Piriformis stretch is equivocal.  Imaging: No results found.  Assessment & Plan: 1.  Chronic left posterior hip and leg pain, etiology uncertain.  The pain acts like a lumbar disc protrusion, but I cannot explain her lack of pain during her last pregnancy.  Could be piriformis syndrome given her improvement with piriformis injection a couple years ago. -Elected to pursue further imaging, we will order new x-rays and MRI scans of lumbar spine and left hip.  If everything is negative, then we will try another piriformis injection.     Procedures: No procedures performed  No notes on file     PMFS History: There are no problems to display for this patient.  Past Medical History:  Diagnosis Date  . Hypertension     History reviewed. No  pertinent family history.  Past Surgical History:  Procedure Laterality Date  . ABDOMINAL SURGERY    . KNEE SURGERY    . TONSILLECTOMY     Social History   Occupational History  . Not on file  Tobacco Use  . Smoking status: Never Smoker  Substance and Sexual Activity  . Alcohol use: Yes  . Drug use: Not on file  . Sexual activity: Not on file

## 2020-01-04 ENCOUNTER — Other Ambulatory Visit: Payer: BC Managed Care – PPO

## 2020-01-05 ENCOUNTER — Ambulatory Visit (INDEPENDENT_AMBULATORY_CARE_PROVIDER_SITE_OTHER): Payer: BC Managed Care – PPO

## 2020-01-05 ENCOUNTER — Other Ambulatory Visit: Payer: Self-pay

## 2020-01-05 DIAGNOSIS — M545 Low back pain, unspecified: Secondary | ICD-10-CM

## 2020-01-05 DIAGNOSIS — M25552 Pain in left hip: Secondary | ICD-10-CM | POA: Diagnosis not present

## 2020-01-05 DIAGNOSIS — G8929 Other chronic pain: Secondary | ICD-10-CM

## 2020-01-06 ENCOUNTER — Telehealth: Payer: Self-pay | Admitting: Family Medicine

## 2020-01-06 NOTE — Telephone Encounter (Signed)
Hip MRI looks essentially normal.  There's a benign-appearing cyst in the right ovary.  No treatment or follow-up needed for that.  Lumbar MRI results are still pending.

## 2020-01-07 ENCOUNTER — Ambulatory Visit (INDEPENDENT_AMBULATORY_CARE_PROVIDER_SITE_OTHER): Payer: BC Managed Care – PPO | Admitting: Family Medicine

## 2020-01-07 ENCOUNTER — Encounter: Payer: Self-pay | Admitting: Family Medicine

## 2020-01-07 ENCOUNTER — Other Ambulatory Visit: Payer: Self-pay

## 2020-01-07 DIAGNOSIS — M25511 Pain in right shoulder: Secondary | ICD-10-CM

## 2020-01-07 DIAGNOSIS — J309 Allergic rhinitis, unspecified: Secondary | ICD-10-CM | POA: Insufficient documentation

## 2020-01-07 DIAGNOSIS — N3010 Interstitial cystitis (chronic) without hematuria: Secondary | ICD-10-CM | POA: Insufficient documentation

## 2020-01-07 MED ORDER — PREDNISONE 10 MG PO TABS: ORAL_TABLET | ORAL | 0 refills | Status: DC

## 2020-01-07 NOTE — Progress Notes (Signed)
Office Visit Note   Patient: Rebekah Hanson           Date of Birth: 13-Aug-1981           MRN: [id number] Visit Date: 01/07/2020 Requested by: Reita Cliche, MD No address on file PCP: Reita Cliche, MD  Subjective: Chief Complaint  Patient presents with  . Right Shoulder - Pain    Pain x couple weeks. Constant soreness. She thinks it is coming from the way she holds her baby out in front of her, to keep weight off her left hip. Taking Advil/Tylenol & Voltaren gel. Some relief w/the oral meds.     HPI: She is here with right shoulder pain.  Symptoms started 2 or 3 weeks ago, no injury.  In order to protect her chronic left hip pain, she has been holding her 18 pound daughter directly in front of her epigastric chest.  She thinks doing this repetitively has started to make her shoulder hurt.  In addition, when she is working, she rests her elbow on a table while using the computer mouse.  This has been hurting as well.  There was never 1 moment where she felt acute pain.  The pain seems to be on the posterior aspect of the shoulder and radiates into the arm occasionally, no numbness or tingling.  Pain is even worse when she reaches to extremes.  She is taking ibuprofen, using Voltaren gel, and doing physical therapy including having a couple laser treatments.  No history of diabetes.  She is breast-feeding her 69-month-old baby.              ROS:   All other systems were reviewed and are negative.  Objective: Vital Signs: There were no vitals taken for this visit.  Physical Exam:  General:  Alert and oriented, in no acute distress. Pulm:  Breathing unlabored. Psy:  Normal mood, congruent affect. Skin: No rash Right shoulder: Her external rotation range of motion is about 60 degrees compared to 90 degrees on the left.  Internal rotation range of motion is 45 compared to 90 on the left.  She has pain at the extremes.  She has tenderness over the [facility] joint with a positive AC  crossover test.  Speeds test is equivocal.  Isometric rotator cuff strength is 5/5 throughout.  She does have some pain with empty can test.  No palpable crepitus in the shoulder with passive range of motion.  She is tender in the anterior subacromial space and has tender trigger points in the trapezius belly and the rhomboid.  Imaging: No results found.  Assessment & Plan: 1.  Right shoulder pain with early adhesive capsulitis, possible AC joint irritation versus subacromial impingement. -We will try oral prednisone.  If she fails to improve, then consider injection.  I would probably perform diagnostic ultrasound first. -Continue with physical therapy.     Procedures: No procedures performed  No notes on file     PMFS History: Patient Active Problem List   Diagnosis Date Noted  . Allergic rhinitis 01/07/2020  . Interstitial cystitis 01/07/2020  . Family history of DVT 05/10/2019  . Fracture of phalanx of foot 01/04/2019  . Sacroiliac joint pain 09/22/2017  . Chronic bladder pain 06/22/2017  . Endometriosis, unspecified 06/14/2016  . Pelvic adhesions 06/14/2016  . Positive ANA (antinuclear antibody) 10/13/2015  . Asthma 09/11/2015  . Female infertility 12/16/2011  . Essential hypertension 07/19/2011   Past Medical History:  Diagnosis Date  . Hypertension  History reviewed. No pertinent family history.  Past Surgical History:  Procedure Laterality Date  . ABDOMINAL SURGERY    . KNEE SURGERY    . TONSILLECTOMY     Social History   Occupational History  . Not on file  Tobacco Use  . Smoking status: Never Smoker  Substance and Sexual Activity  . Alcohol use: Yes  . Drug use: Not on file  . Sexual activity: Not on file

## 2020-01-13 ENCOUNTER — Telehealth: Payer: Self-pay | Admitting: Family Medicine

## 2020-01-13 DIAGNOSIS — M545 Low back pain, unspecified: Secondary | ICD-10-CM

## 2020-01-13 DIAGNOSIS — G8929 Other chronic pain: Secondary | ICD-10-CM

## 2020-01-13 NOTE — Telephone Encounter (Signed)
MRI shows left-sided narrowing of the nerve opening at L4-5.  Nerve is not compressed at the time of the MRI, but could potentially be the source of symptoms depending on positioning.

## 2020-01-16 NOTE — Addendum Note (Signed)
Addended by: Lillia Carmel on: 01/16/2020 09:20 AM   Modules accepted: Orders

## 2020-01-24 NOTE — Telephone Encounter (Signed)
I sent the patient a separate MyChart message about scheduling this appointment.

## 2020-01-31 ENCOUNTER — Ambulatory Visit: Payer: BC Managed Care – PPO | Admitting: Family Medicine

## 2020-02-05 ENCOUNTER — Ambulatory Visit (INDEPENDENT_AMBULATORY_CARE_PROVIDER_SITE_OTHER): Payer: BC Managed Care – PPO | Admitting: Family Medicine

## 2020-02-05 ENCOUNTER — Ambulatory Visit: Payer: Self-pay

## 2020-02-05 ENCOUNTER — Encounter: Payer: Self-pay | Admitting: Family Medicine

## 2020-02-05 ENCOUNTER — Other Ambulatory Visit: Payer: Self-pay

## 2020-02-05 DIAGNOSIS — M25511 Pain in right shoulder: Secondary | ICD-10-CM | POA: Diagnosis not present

## 2020-02-05 MED ORDER — TRAMADOL HCL 50 MG PO TABS: 50.0000 mg | ORAL_TABLET | Freq: Four times a day (QID) | ORAL | 0 refills | Status: DC | PRN

## 2020-02-05 NOTE — Progress Notes (Signed)
   Office Visit Note   Patient: Rebekah Hanson           Date of Birth: [birth year]           MRN: [id number] Visit Date: 02/05/2020 Requested by: Brooke Bonito, MD No address on file PCP: Brooke Bonito, MD  Subjective: Chief Complaint  Patient presents with  . Right Shoulder - Pain    HPI: She is here with persistent right shoulder pain.  She is doing physical therapy and has temporary improvement, but she still cannot regain full range of motion.  Pain is minimal with day-to-day activities.  Pain is most intense when reaching overhead or behind the back.              ROS:   All other systems were reviewed and are negative.  Objective: Vital Signs: There were no vitals taken for this visit.  Physical Exam:  General:  Alert and oriented, in no acute distress. Pulm:  Breathing unlabored. Psy:  Normal mood, congruent affect  Right shoulder: She has abduction of 90 degrees, external rotation limited to about 75 degrees and internal rotation limited to about 45 degrees.  Pain at the extremes.   Imaging: US Guided Needle Placement  Result Date: 02/05/2020 Ultrasound-guided right glenohumeral injection: After sterile prep with Betadine, injected 8 cc 1% lidocaine without epinephrine and 40 mg methylprednisolone using a 22-gauge spinal needle, passing the needle from posterior approach into the glenohumeral joint.  Injectate was seen filling the joint capsule.  Excellent immediate relief, with significant improvement in range of motion in all directions.  Also evaluated the rotator cuff and she did not have any obvious tears.  There is some thickening of the subacromial/subdeltoid bursa.    Assessment & Plan: 1.  Right shoulder adhesive capsulitis with subacromial/subdeltoid bursitis -Injection given as above with significant immediate relief of symptoms and improvement in range of motion.  She will continue with physical therapy.  Tramadol as needed.  We could repeat this  in the future if needed, but based on her response today I think this will help her quite a bit.     Procedures: No procedures performed  No notes on file     PMFS History: Patient Active Problem List   Diagnosis Date Noted  . Allergic rhinitis 01/07/2020  . Interstitial cystitis 01/07/2020  . Family history of DVT 05/10/2019  . Fracture of phalanx of foot 01/04/2019  . Sacroiliac joint pain 09/22/2017  . Chronic bladder pain 06/22/2017  . Endometriosis, unspecified 06/14/2016  . Pelvic adhesions 06/14/2016  . Positive ANA (antinuclear antibody) 10/13/2015  . Asthma 09/11/2015  . Female infertility 12/16/2011  . Essential hypertension 07/19/2011   Past Medical History:  Diagnosis Date  . Hypertension     History reviewed. No pertinent family history.  Past Surgical History:  Procedure Laterality Date  . ABDOMINAL SURGERY    . KNEE SURGERY    . TONSILLECTOMY     Social History   Occupational History  . Not on file  Tobacco Use  . Smoking status: Never Smoker  Substance and Sexual Activity  . Alcohol use: Yes  . Drug use: Not on file  . Sexual activity: Not on file

## 2020-02-24 ENCOUNTER — Telehealth: Payer: Self-pay | Admitting: Physical Medicine and Rehabilitation

## 2020-02-24 NOTE — Telephone Encounter (Signed)
Documentation in referral. 

## 2020-02-24 NOTE — Telephone Encounter (Signed)
Patient called requesting a call back. Patient states a voicemail was left on her phone to call Dr. Alvester Morin office for lumbar injection. Please give patient a call back at [phone].

## 2020-03-05 ENCOUNTER — Telehealth: Payer: Self-pay | Admitting: Internal Medicine

## 2020-03-05 NOTE — Telephone Encounter (Signed)
Tiffany -- can you contact pt to schedule new pt appt per PCP approval below?

## 2020-03-05 NOTE — Telephone Encounter (Signed)
Per patient her mother/father see you for primary care (Candace and Haskell Flirt). Patient states she would like to establish care with you as well. Her Pcp retired.  Please Advise

## 2020-03-05 NOTE — Telephone Encounter (Signed)
OK 

## 2020-03-23 ENCOUNTER — Encounter: Payer: Self-pay | Admitting: Family Medicine

## 2020-03-23 DIAGNOSIS — M545 Low back pain, unspecified: Secondary | ICD-10-CM

## 2020-03-23 DIAGNOSIS — G8929 Other chronic pain: Secondary | ICD-10-CM

## 2020-03-25 ENCOUNTER — Encounter: Payer: BC Managed Care – PPO | Admitting: Physical Medicine and Rehabilitation

## 2020-03-26 NOTE — Addendum Note (Signed)
Addended by: Lillia Carmel on: 03/26/2020 11:52 AM   Modules accepted: Orders

## 2020-04-08 ENCOUNTER — Other Ambulatory Visit: Payer: Self-pay

## 2020-04-08 ENCOUNTER — Encounter: Payer: Self-pay | Admitting: Family Medicine

## 2020-04-08 ENCOUNTER — Ambulatory Visit (INDEPENDENT_AMBULATORY_CARE_PROVIDER_SITE_OTHER): Payer: BC Managed Care – PPO | Admitting: Family Medicine

## 2020-04-08 VITALS — BP 110/72 | HR 79 | Temp 98.9°F | Ht 67.0 in | Wt 142.5 lb

## 2020-04-08 DIAGNOSIS — Z1159 Encounter for screening for other viral diseases: Secondary | ICD-10-CM

## 2020-04-08 DIAGNOSIS — Z0000 Encounter for general adult medical examination without abnormal findings: Secondary | ICD-10-CM | POA: Diagnosis not present

## 2020-04-08 MED ORDER — VALSARTAN 80 MG PO TABS: 80.0000 mg | ORAL_TABLET | Freq: Every day | ORAL | 3 refills | Status: DC

## 2020-04-08 MED ORDER — ESCITALOPRAM OXALATE 5 MG PO TABS: 5.0000 mg | ORAL_TABLET | Freq: Every day | ORAL | 3 refills | Status: DC

## 2020-04-08 NOTE — Patient Instructions (Addendum)
Give [place] 2-3 business days to get the results of your labs back.   Keep the diet clean and stay active.  Monitor blood pressure at home. If 140/90 routinely, let me know.   I recommend getting the flu shot in mid October. This suggestion would change if the CDC comes out with a different recommendation.   If we aren't doing well with the Lexapro in 1 month, we should see each other in the office.  Don't get pregnant on these medicines please.   Coping skills Choose 5 that work for you:  Take a deep breath  Count to 20  Read a book  Do a puzzle  Meditate  Bake  Sing  Knit  Garden  Pray  Go outside  Call a friend  Listen to music  Take a walk  Color  Send a note  Take a bath  Watch a movie  Be alone in a quiet place  Pet an animal  Visit a friend  Journal  Exercise  Stretch   Let us know if you need anything.

## 2020-04-08 NOTE — Progress Notes (Signed)
Chief Complaint  Patient presents with  . New Patient (Initial Visit)     Well Woman Rebekah Hanson is here for a complete physical.   Her last physical was >1 year ago.  Current diet: in general, a "good" diet. Current exercise: active at home, walking. Weight is stable and she denies fatigue out of ordinary. Seatbelt? Yes  Health Maintenance Pap/HPV- Yes  Mammogram- No Tetanus- Yes Hep C screening- No HIV screening- Yes  Past Medical History:  Diagnosis Date  . Endometriosis   . Hypertension   . Interstitial cystitis      Past Surgical History:  Procedure Laterality Date  . ABDOMINAL SURGERY    . CESAREAN SECTION  2020  . KNEE SURGERY    . TONSILLECTOMY      Medications  Current Outpatient Medications on File Prior to Visit  Medication Sig Dispense Refill  . Cholecalciferol (VITAMIN D3) 125 MCG (5000 UT) CAPS Take by mouth daily.     Allergies Allergies  Allergen Reactions  . Doxycycline Photosensitivity and Rash    Breaks out in rash in the sun Breaks out in rash in the sun   . Amoxicillin Hives  . Erythromycin Nausea And Vomiting  . Other Other (See Comments) and Nausea And Vomiting    Mussels/Oysters Mussels/Oysters     Review of Systems: Constitutional:  no unexpected weight changes Eye:  no recent significant change in vision Ear/Nose/Mouth/Throat:  Ears:  no recent change in hearing Nose/Mouth/Throat:  no complaints of nasal congestion, no sore throat Cardiovascular: no chest pain Respiratory:  no shortness of breath Gastrointestinal:  no abdominal pain, no change in bowel habits GU:  Female: negative for dysuria or pelvic pain Musculoskeletal/Extremities:  no pain of the joints Integumentary (Skin/Breast):  no abnormal skin lesions reported Neurologic:  no headaches Endocrine:  denies fatigue Psych: +anxiety  Exam BP 110/72 (BP Location: Left Arm, Patient Position: Sitting, Cuff Size: Normal)   Pulse 79   Temp 98.9 F (37.2 C)  (Oral)   Ht 5\' 7"  (1.702 m)   Wt 142 lb 8 oz (64.6 kg)   SpO2 98%   BMI 22.32 kg/m  General:  well developed, well nourished, in no apparent distress Skin:  no significant moles, warts, or growths Head:  no masses, lesions, or tenderness Eyes:  pupils equal and round, sclera anicteric without injection Ears:  canals without lesions, TMs shiny without retraction, no obvious effusion, no erythema Nose:  nares patent, septum midline, mucosa normal, and no drainage or sinus tenderness Throat/Pharynx:  lips and gingiva without lesion; tongue and uvula midline; non-inflamed pharynx; no exudates or postnasal drainage Neck: neck supple without adenopathy, thyromegaly, or masses Lungs:  clear to auscultation, breath sounds equal bilaterally, no respiratory distress Cardio:  regular rate and rhythm, no LE edema Abdomen:  abdomen soft, nontender; bowel sounds normal; no masses or organomegaly Genital: Defer to GYN Musculoskeletal:  symmetrical muscle groups noted without atrophy or deformity Extremities:  no clubbing, cyanosis, or edema, no deformities, no skin discoloration Neuro:  gait normal; deep tendon reflexes normal and symmetric Psych: well oriented with normal range of affect and appropriate judgment/insight  Assessment and Plan  Well adult exam - Plan: CBC, Comprehensive metabolic panel, Lipid panel  Encounter for hepatitis C screening test for low risk patient - Plan: Hepatitis C antibody   Well 39 y.o. female. Counseled on diet and exercise. Pt will work with OB/GYN regarding mammogram.  Other orders as above. Change BP meds to Valsartan 80  mg/d. Monitor at home, f/u here if 140/90 or higher consistently. Add Lexapro as she has had increased anxiety/dep since stopping breast feeding. Did well on this in past. I'll see her in 6 weeks if this is not helpful.  Follow up in 6 mo or prn. The patient voiced understanding and agreement to the plan.  Jilda Roche [place],  DO 04/08/20 2:04 PM

## 2020-04-09 LAB — COMPREHENSIVE METABOLIC PANEL
ALT: 12 U/L (ref 0–35)
AST: 17 U/L (ref 0–37)
Albumin: 4.7 g/dL (ref 3.5–5.2)
Alkaline Phosphatase: 61 U/L (ref 39–117)
BUN: 18 mg/dL (ref 6–23)
CO2: 25 mEq/L (ref 19–32)
Calcium: 9.3 mg/dL (ref 8.4–10.5)
Chloride: 105 mEq/L (ref 96–112)
Creatinine, Ser: 0.78 mg/dL (ref 0.40–1.20)
GFR: 81.54 mL/min (ref >60.00)
Glucose, Bld: 81 mg/dL (ref 70–99)
Potassium: 4 mEq/L (ref 3.5–5.1)
Sodium: 139 mEq/L (ref 135–145)
Total Bilirubin: 0.5 mg/dL (ref 0.2–1.2)
Total Protein: 7.3 g/dL (ref 6.0–8.3)

## 2020-04-09 LAB — LIPID PANEL
Cholesterol: 173 mg/dL (ref 0–200)
HDL: 70.4 mg/dL (ref >39.00)
LDL Cholesterol: 88 mg/dL (ref 0–99)
NonHDL: 102.62
Total CHOL/HDL Ratio: 2
Triglycerides: 74 mg/dL (ref 0.0–149.0)
VLDL: 14.8 mg/dL (ref 0.0–40.0)

## 2020-04-09 LAB — CBC
HCT: 38.4 % (ref 36.0–46.0)
Hemoglobin: 13 g/dL (ref 12.0–15.0)
MCHC: 33.9 g/dL (ref 30.0–36.0)
MCV: 95.6 fl (ref 78.0–100.0)
Platelets: 266 10*3/uL (ref 150.0–400.0)
RBC: 4.02 Mil/uL (ref 3.87–5.11)
RDW: 13.4 % (ref 11.5–15.5)
WBC: 6.8 10*3/uL (ref 4.0–10.5)

## 2020-04-09 LAB — HEPATITIS C ANTIBODY
Hepatitis C Ab: NONREACTIVE
SIGNAL TO CUT-OFF: 0.01 (ref <1.00)

## 2020-04-10 ENCOUNTER — Inpatient Hospital Stay: Admission: RE | Admit: 2020-04-10 | Payer: BC Managed Care – PPO | Source: Ambulatory Visit

## 2020-04-17 ENCOUNTER — Inpatient Hospital Stay: Admission: RE | Admit: 2020-04-17 | Payer: BC Managed Care – PPO | Source: Ambulatory Visit

## 2020-04-22 ENCOUNTER — Other Ambulatory Visit: Payer: Self-pay

## 2020-04-22 ENCOUNTER — Telehealth: Payer: Self-pay | Admitting: Family Medicine

## 2020-04-22 ENCOUNTER — Encounter: Payer: Self-pay | Admitting: Family Medicine

## 2020-04-22 ENCOUNTER — Telehealth (INDEPENDENT_AMBULATORY_CARE_PROVIDER_SITE_OTHER): Payer: BC Managed Care – PPO | Admitting: Family Medicine

## 2020-04-22 DIAGNOSIS — J029 Acute pharyngitis, unspecified: Secondary | ICD-10-CM

## 2020-04-22 DIAGNOSIS — Z20818 Contact with and (suspected) exposure to other bacterial communicable diseases: Secondary | ICD-10-CM | POA: Diagnosis not present

## 2020-04-22 MED ORDER — FLUCONAZOLE 150 MG PO TABS: ORAL_TABLET | ORAL | 0 refills | Status: DC

## 2020-04-22 MED ORDER — CEPHALEXIN 500 MG PO CAPS: 500.0000 mg | ORAL_CAPSULE | Freq: Two times a day (BID) | ORAL | 0 refills | Status: AC

## 2020-04-22 NOTE — Telephone Encounter (Signed)
Patient scheduled a video visit today

## 2020-04-22 NOTE — Progress Notes (Signed)
CC: ST  Rebekah Hanson here for URI complaints. Due to COVID-19 pandemic, we are interacting via web portal for an electronic face-to-face visit. I verified patient's ID using 2 identifiers. Patient agreed to proceed with visit via this method. Patient is at home, I am at office. Patient and I are present for visit.   Duration: 1 day  Associated symptoms: sinus headache, sore throat and malaise; unable to look in throat Denies: sinus congestion, sinus pain, rhinorrhea, itchy watery eyes, ear pain, ear drainage, wheezing, shortness of breath, myalgia and fevers, cough Treatment to date: none Sick contacts: Yes- son dx'd w strep  Past Medical History:  Diagnosis Date  . Endometriosis   . Hypertension   . Interstitial cystitis     Exam No conversational dyspnea Age appropriate judgment and insight Nml affect and mood  Sore throat - Plan: cephALEXin (KEFLEX) 500 MG capsule, fluconazole (DIFLUCAN) 150 MG tablet  Exposure to strep throat  Continue to push fluids, practice good hand hygiene, ibuprofen. Hold on COVID testing for now. Will send info while results pending for son.  F/u prn. If starting to experience fevers, shaking, or shortness of breath, seek immediate care. Total time: 6 min Pt voiced understanding and agreement to the plan.  Jilda Roche [place], DO 04/22/20 11:52 AM

## 2020-04-22 NOTE — Telephone Encounter (Signed)
New Message:   Pt is calling and states that her son tested positive for Strep and now she has a sore throat. She wants to know what should her next step be. Please advise.

## 2020-04-22 NOTE — Telephone Encounter (Signed)
New Message:   Pt is calling back to see if a antibiotic can be called in for her Strep throat since her son tested positive for it. Please advise.

## 2020-04-24 ENCOUNTER — Telehealth: Payer: BC Managed Care – PPO | Admitting: Family Medicine

## 2020-06-10 ENCOUNTER — Encounter: Payer: Self-pay | Admitting: Family Medicine

## 2020-06-15 DIAGNOSIS — R102 Pelvic and perineal pain: Secondary | ICD-10-CM | POA: Insufficient documentation

## 2020-06-15 DIAGNOSIS — N946 Dysmenorrhea, unspecified: Secondary | ICD-10-CM | POA: Insufficient documentation

## 2020-06-15 DIAGNOSIS — N921 Excessive and frequent menstruation with irregular cycle: Secondary | ICD-10-CM | POA: Insufficient documentation

## 2020-06-16 ENCOUNTER — Ambulatory Visit (INDEPENDENT_AMBULATORY_CARE_PROVIDER_SITE_OTHER): Payer: BC Managed Care – PPO | Admitting: Family Medicine

## 2020-06-16 ENCOUNTER — Ambulatory Visit: Payer: Self-pay

## 2020-06-16 ENCOUNTER — Other Ambulatory Visit: Payer: Self-pay

## 2020-06-16 ENCOUNTER — Encounter: Payer: Self-pay | Admitting: Family Medicine

## 2020-06-16 DIAGNOSIS — M25511 Pain in right shoulder: Secondary | ICD-10-CM

## 2020-06-16 MED ORDER — HYDROCODONE-ACETAMINOPHEN 5-325 MG PO TABS: 1.0000 | ORAL_TABLET | Freq: Four times a day (QID) | ORAL | 0 refills | Status: DC | PRN

## 2020-06-16 NOTE — Progress Notes (Signed)
Office Visit Note   Patient: Rebekah Hanson           Date of Birth: [birth year]           MRN: [id number] Visit Date: 06/16/2020 Requested by: Sharlene Dory, DO [address] [place],  [place] [address] PCP: Sharlene Dory, DO  Subjective: Chief Complaint  Patient presents with  . Right Shoulder - Pain, Follow-up    Repeat glenohumeral cortisone injection    HPI: He is here with recurrent right shoulder pain.  Glenohumeral injection in June helped quite a bit.  Her range of motion and pain improved although she was never pain-free.  She is still working with [place] for physical therapy, but recently she has taken a turn for the worse and has had increased pain as well as significantly decreased range of motion.               ROS:   All other systems were reviewed and are negative.  Objective: Vital Signs: There were no vitals taken for this visit.  Physical Exam:  General:  Alert and oriented, in no acute distress. Pulm:  Breathing unlabored. Psy:  Normal mood, congruent affect.  Right shoulder: She has abduction of 45 degrees, external rotation of about 75 degrees and internal rotation just past neutral.  She has pain at the extremes of each of these.  Isometric rotator cuff strength does not cause significant pain.  Imaging: US Guided Needle Placement - No Linked Charges  Result Date: 06/16/2020 Ultrasound-guided right glenohumeral injection: After sterile prep with Betadine, injected 8 cc 1% lidocaine without epinephrine and 40 mg methylprednisolone using a 22-gauge spinal needle, passing the needle from posterior approach into the glenohumeral joint.  Injectate seen filling joint capsule.  Improvement in pain and ROM during anesthetic phase.     Assessment & Plan: 1.  Right shoulder adhesive capsulitis -Elected to inject the glenohumeral joint 1 more time today.  If this does not give lasting relief, then MRI arthrogram to look for  labrum pathology followed by surgical consult if indicated.  Hydrocodone given for postinjection pain.     Procedures: No procedures performed  No notes on file     PMFS History: Patient Active Problem List   Diagnosis Date Noted  . Dysmenorrhea 06/15/2020  . Menorrhagia with irregular cycle 06/15/2020  . Pelvic pain 06/15/2020  . Allergic rhinitis 01/07/2020  . Interstitial cystitis 01/07/2020  . Family history of DVT 05/10/2019  . Fracture of phalanx of foot 01/04/2019  . Sacroiliac joint pain 09/22/2017  . Chronic bladder pain 06/22/2017  . Endometriosis, unspecified 06/14/2016  . Pelvic adhesions 06/14/2016  . Positive ANA (antinuclear antibody) 10/13/2015  . Asthma 09/11/2015  . Female infertility 12/16/2011  . Essential hypertension 07/19/2011   Past Medical History:  Diagnosis Date  . Endometriosis   . Hypertension   . Interstitial cystitis     Family History  Problem Relation Age of Onset  . Rheum arthritis Mother   . Hypertension Mother   . Cancer Father   . Thymic aplasia Father   . Autoimmune disease Father   . Cancer Brother        thyroid cancer  . Hypertension Brother     Past Surgical History:  Procedure Laterality Date  . ABDOMINAL SURGERY    . CESAREAN SECTION  2020  . KNEE SURGERY    . TONSILLECTOMY     Social History   Occupational History  .  Not on file  Tobacco Use  . Smoking status: Never Smoker  Substance and Sexual Activity  . Alcohol use: Yes  . Drug use: Not on file  . Sexual activity: Not on file

## 2020-06-19 ENCOUNTER — Encounter: Payer: Self-pay | Admitting: Family Medicine

## 2020-06-19 DIAGNOSIS — M25511 Pain in right shoulder: Secondary | ICD-10-CM

## 2020-06-25 ENCOUNTER — Other Ambulatory Visit: Payer: Self-pay | Admitting: Family Medicine

## 2020-06-25 DIAGNOSIS — M25511 Pain in right shoulder: Secondary | ICD-10-CM

## 2020-07-17 ENCOUNTER — Other Ambulatory Visit: Payer: BC Managed Care – PPO

## 2020-09-05 ENCOUNTER — Other Ambulatory Visit: Payer: Self-pay | Admitting: Family Medicine

## 2020-10-09 ENCOUNTER — Ambulatory Visit: Payer: BC Managed Care – PPO | Admitting: Family Medicine

## 2020-11-05 IMAGING — MR MR LUMBAR SPINE W/O CM
4 of 5 series · 27 of 48 positions shown · non-contrast
Comparison: MRI 01/15/2016

CLINICAL DATA: Chronic low back pain with left-sided radiculopathy

EXAM:
MRI LUMBAR SPINE WITHOUT CONTRAST
TECHNIQUE: Multiplanar, multisequence MR imaging of the lumbar spine was
performed. No intravenous contrast was administered.

[Series 4: T2 · sagittal · 4.0mm · 0.81mm/px · 6 of 15 slices shown (1 of 2)]
[im 1/15]
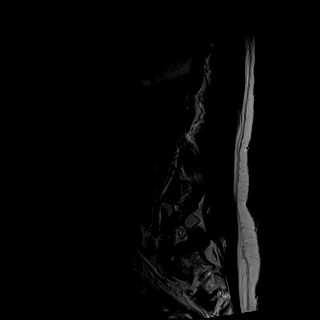
[im 3/15]
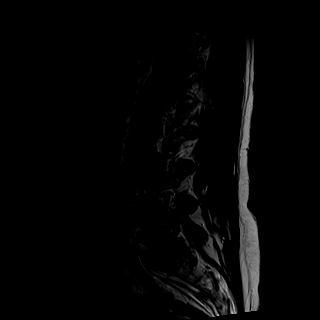
[im 6/15]
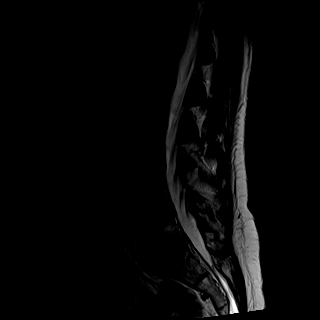
[im 9/15]
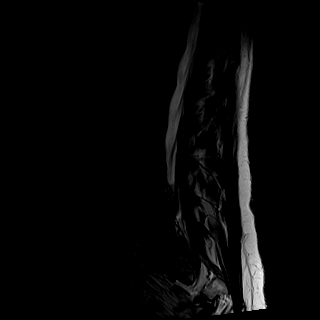
[im 12/15]
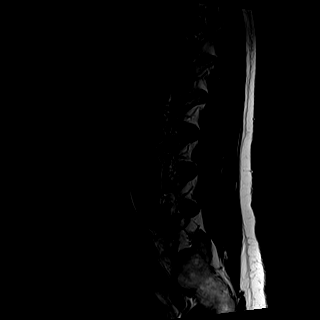
[im 15/15]
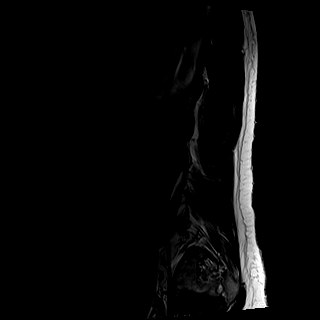

[Series 5: T1 · sagittal · 4.0mm · 0.41mm/px · 5 of 15 slices shown (1 of 2)]
[im 1/15]
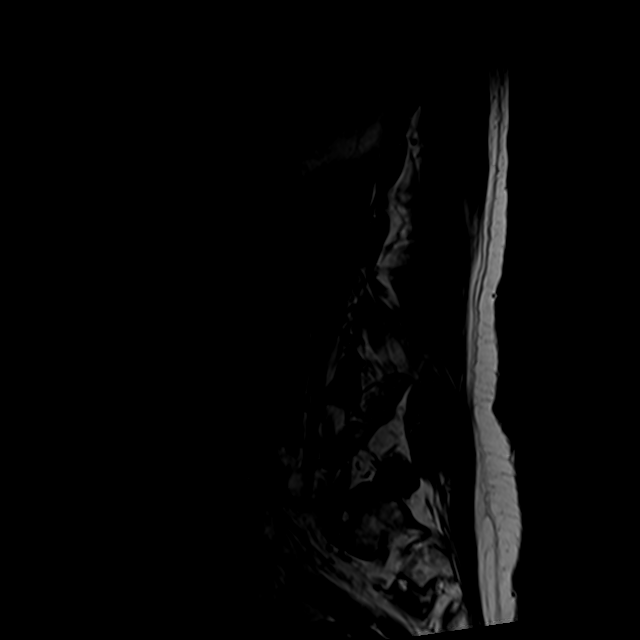
[im 4/15]
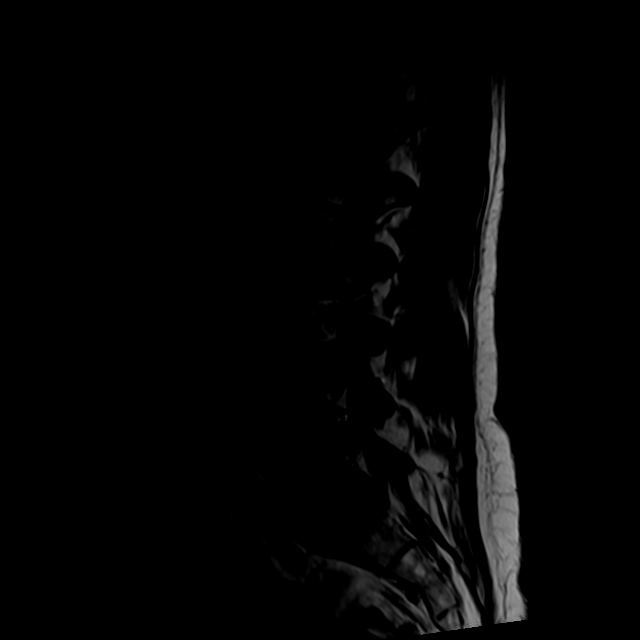
[im 8/15]
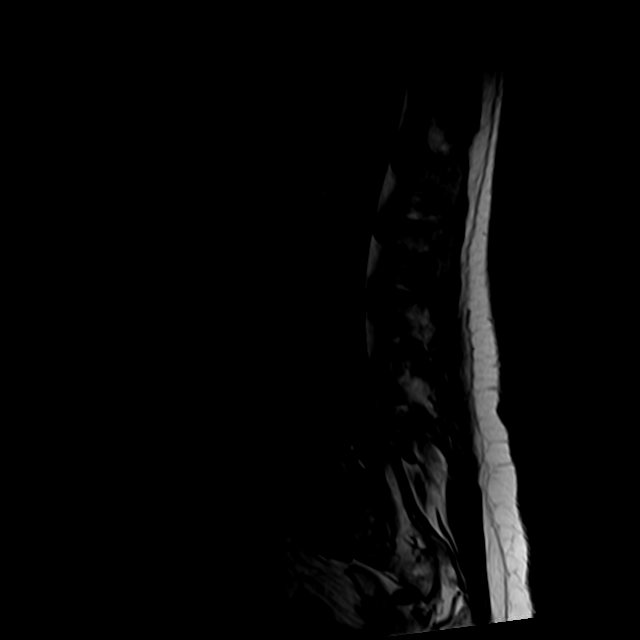
[im 11/15]
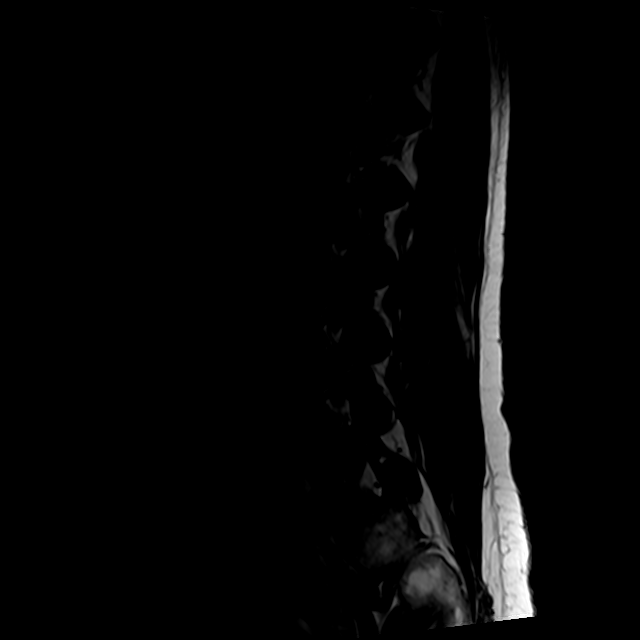
[im 15/15]
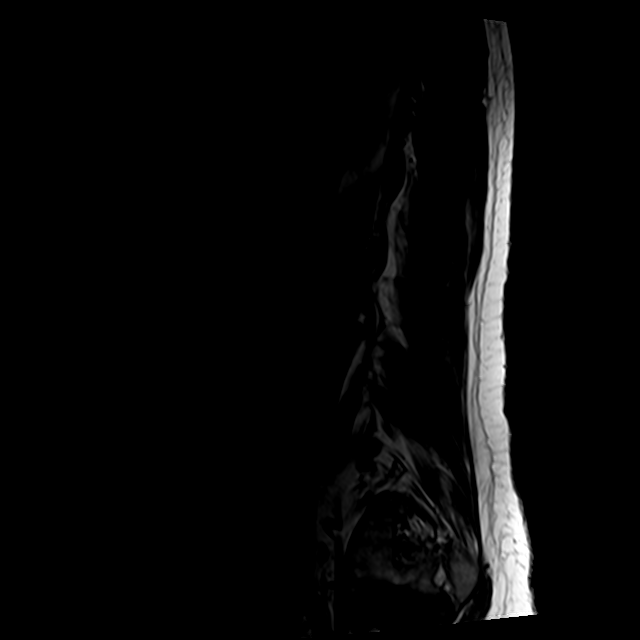

[Series 9: T2 · axial · 4.0mm · 0.78mm/px · z∈[+36,+271]mm · 10 of 43 slices shown (2 of 2)]
[im 3/43]
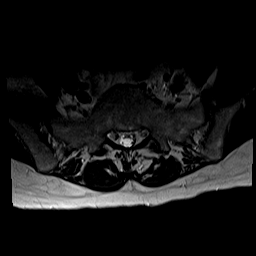
[im 6/43]
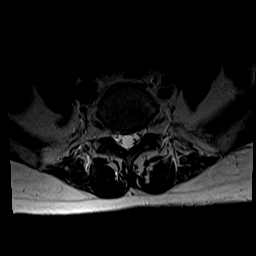
[im 9/43]
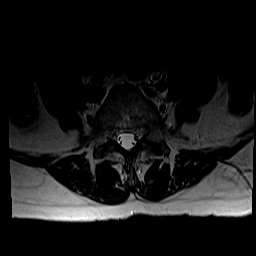
[im 15/43]
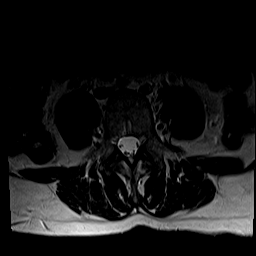
[im 20/43]
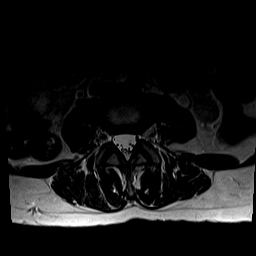
[im 23/43]
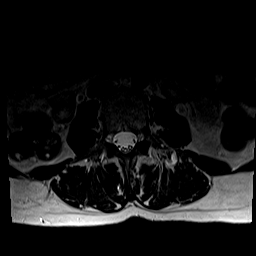
[im 26/43]
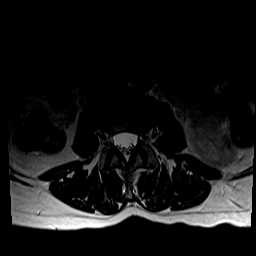
[im 31/43]
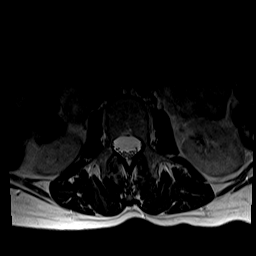
[im 37/43]
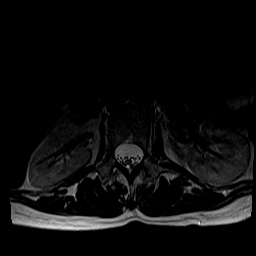
[im 43/43]
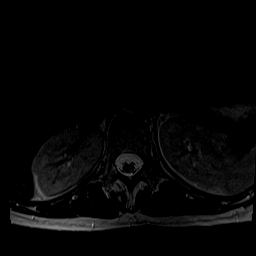

[Series 12: T1 · axial · 4.0mm · 0.39mm/px · z∈[+36,+241]mm · 6 of 43 slices shown (2 of 2)]
[im 3/43]
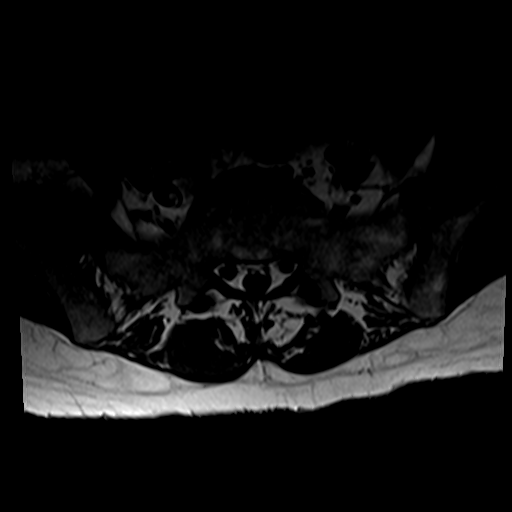
[im 6/43]
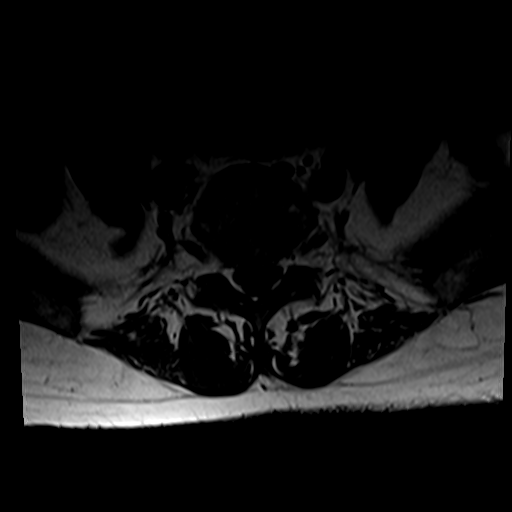
[im 9/43]
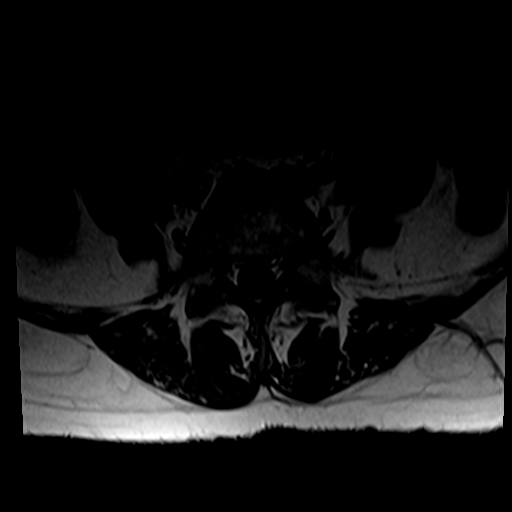
[im 15/43]
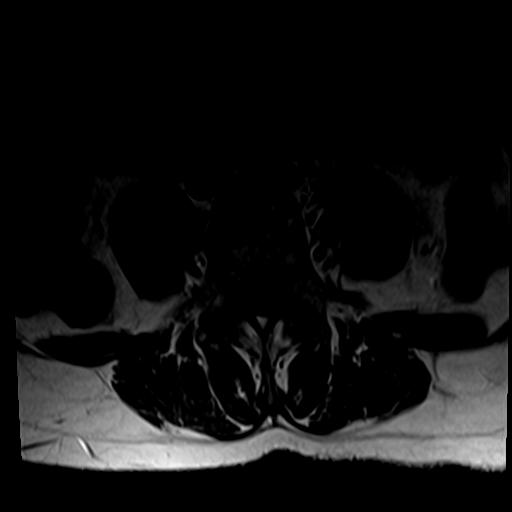
[im 23/43]
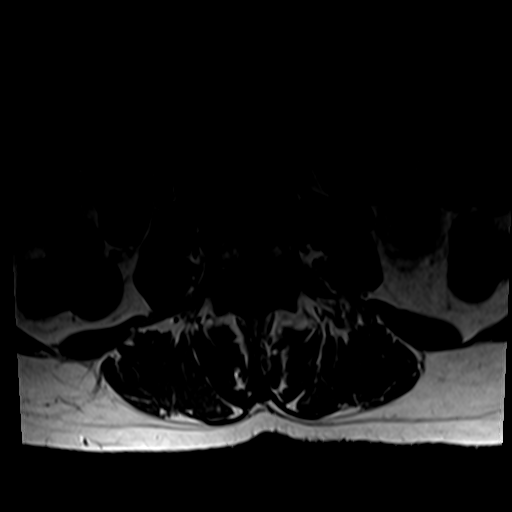
[im 37/43]
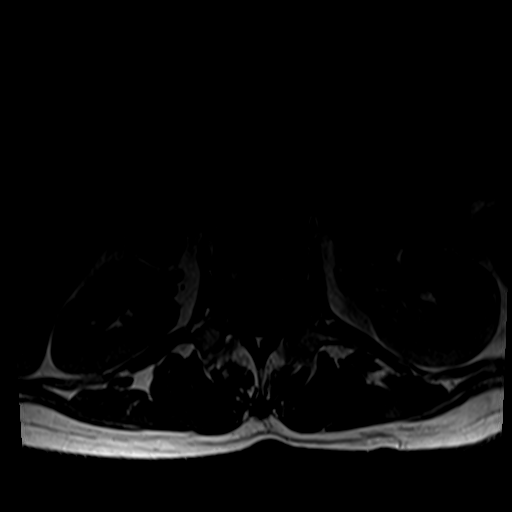

[27 of 48 positions shown; findings below may reference images not displayed]

FINDINGS: Segmentation:  Standard.

Alignment:  Physiologic.

Vertebrae:  No fracture, evidence of discitis, or bone lesion.

Conus medullaris and cauda equina: Conus extends to the L1 level.
Conus and cauda equina appear normal.

Paraspinal and other soft tissues: Negative.

Disc levels:

T12-L1: Unremarkable.

L1-L2: Unremarkable.

L2-L3: Unremarkable.

L3-L4: Unremarkable.

L4-L5: Disc desiccation with mild height loss and a minimal left
paracentral protrusion. A posterior annular fissure is again noted.
Minimal left greater than right facet arthropathy. Mild left
subarticular recess narrowing without evidence of neural
impingement. There is no foraminal or canal stenosis. No appreciable
interval change from prior.

L5-S1: Disc desiccation with shallow right paracentral disc
protrusion and annular fissure in the right subarticular zone.
Unremarkable facet joints. No foraminal, canal, or subarticular
recess stenosis. No appreciable interval change from prior.
IMPRESSION: 1. Stable mild degenerative changes at L4-L5 and L5-S1.
2. Mild left subarticular recess narrowing at L4-5 without evidence
of neural impingement. This may potentially affect the left L5 nerve
root distribution.
3. No spinal canal or foraminal stenosis.

## 2020-12-21 ENCOUNTER — Ambulatory Visit: Payer: BC Managed Care – PPO | Admitting: Internal Medicine

## 2020-12-21 ENCOUNTER — Other Ambulatory Visit: Payer: Self-pay

## 2020-12-24 ENCOUNTER — Other Ambulatory Visit: Payer: Self-pay

## 2020-12-24 ENCOUNTER — Ambulatory Visit (INDEPENDENT_AMBULATORY_CARE_PROVIDER_SITE_OTHER): Payer: BC Managed Care – PPO | Admitting: Family

## 2020-12-24 ENCOUNTER — Ambulatory Visit (HOSPITAL_BASED_OUTPATIENT_CLINIC_OR_DEPARTMENT_OTHER)
Admission: RE | Admit: 2020-12-24 | Discharge: 2020-12-24 | Disposition: A | Discharge status: Home / Self Care | Payer: BC Managed Care – PPO | Source: Ambulatory Visit | Attending: Family | Admitting: Family

## 2020-12-24 VITALS — BP 126/84 | HR 57 | Temp 97.4°F | Resp 18 | Ht 67.0 in | Wt 147.0 lb

## 2020-12-24 DIAGNOSIS — J209 Acute bronchitis, unspecified: Secondary | ICD-10-CM

## 2020-12-24 DIAGNOSIS — R059 Cough, unspecified: Secondary | ICD-10-CM

## 2020-12-24 MED ORDER — ALBUTEROL SULFATE HFA 108 (90 BASE) MCG/ACT IN AERS: 2.0000 | INHALATION_SPRAY | Freq: Four times a day (QID) | RESPIRATORY_TRACT | 1 refills | Status: DC | PRN

## 2020-12-24 MED ORDER — LEVOFLOXACIN 500 MG PO TABS: 500.0000 mg | ORAL_TABLET | Freq: Every day | ORAL | 0 refills | Status: AC

## 2020-12-24 NOTE — Progress Notes (Signed)
Rebekah Hanson is a 40 y.o. female with the following history as recorded in Epic[facility]:  Patient Active Problem List   Diagnosis Date Noted  . Dysmenorrhea 06/15/2020  . Menorrhagia with irregular cycle 06/15/2020  . Pelvic pain 06/15/2020  . Allergic rhinitis 01/07/2020  . Interstitial cystitis 01/07/2020  . Family history of DVT 05/10/2019  . Fracture of phalanx of foot 01/04/2019  . Sacroiliac joint pain 09/22/2017  . Chronic bladder pain 06/22/2017  . Endometriosis, unspecified 06/14/2016  . Pelvic adhesions 06/14/2016  . Positive ANA (antinuclear antibody) 10/13/2015  . Asthma 09/11/2015  . Female infertility 12/16/2011  . Essential hypertension 07/19/2011    Current Outpatient Medications  Medication Sig Dispense Refill  . albuterol (VENTOLIN HFA) 108 (90 Base) MCG/ACT inhaler Inhale 2 puffs into the lungs every 6 (six) hours as needed for wheezing or shortness of breath. 8 g 1  . Cholecalciferol (VITAMIN D3) 125 MCG (5000 UT) CAPS Take by mouth daily.    Marland Kitchen escitalopram (LEXAPRO) 5 MG tablet Take 1 tablet (5 mg total) by mouth daily. 30 tablet 3  . levofloxacin (LEVAQUIN) 500 MG tablet Take 1 tablet (500 mg total) by mouth daily for 10 days. 10 tablet 0  . valsartan (DIOVAN) 80 MG tablet TAKE 1 TABLET(80 MG) BY MOUTH DAILY 30 tablet 3   No current facility-administered medications for this visit.    Allergies: Doxycycline, Amoxicillin, Erythromycin, and Other  Past Medical History:  Diagnosis Date  . Endometriosis   . Hypertension   . Interstitial cystitis     Past Surgical History:  Procedure Laterality Date  . ABDOMINAL SURGERY    . CESAREAN SECTION  2020  . KNEE SURGERY    . TONSILLECTOMY      Family History  Problem Relation Age of Onset  . Rheum arthritis Mother   . Hypertension Mother   . Cancer Father   . Thymic aplasia Father   . Autoimmune disease Father   . Cancer Brother        thyroid cancer  . Hypertension Brother     Social History    Tobacco Use  . Smoking status: Never Smoker  . Smokeless tobacco: Not on file  Substance Use Topics  . Alcohol use: Yes    Subjective:   Seen at [facility] on [facility] with concerns for sinus infection/ bronchitis; notes she is prone to bronchitis and pneumonia; + seasonal allergies; does have albuterol inhaler but has not needed; was given Rx for prednisone but opted not to take due to potential side effects; finishing Z-pak today with persisting symptoms;   LMP 12/07/2020- no concerns for being pregnant    Objective:  Vitals:   12/24/20 1549  BP: 126/84  Pulse: (!) 57  Resp: 18  Temp: (!) 97.4 F (36.3 C)  SpO2: 98%  Weight: 147 lb (66.7 kg)  Height: 5\' 7"  (1.702 m)    General: Well developed, well nourished, in no acute distress  Skin : Warm and dry.  Head: Normocephalic and atraumatic  Eyes: Sclera and conjunctiva clear; pupils round and reactive to light; extraocular movements intact  Ears: External normal; canals clear; tympanic membranes normal  Oropharynx: Pink, supple. No suspicious lesions  Neck: Supple without thyromegaly, adenopathy  Lungs: Respirations unlabored; wheezing noted;  CVS exam: normal rate and regular rhythm.  Neurologic: Alert and oriented; speech intact; face symmetrical; moves all extremities well; CNII-XII intact without focal deficit   Assessment:  1. Cough   2. Acute bronchitis, unspecified organism  Plan:  Concern for possible pneumonia; update CXR today; change to Levaquin 500 mg qd x 10 days; refill on albuterol; increase fluids, rest and follow-up worse, no better.  This visit occurred during the SARS-CoV-2 public health emergency.  Safety protocols were in place, including screening questions prior to the visit, additional usage of staff PPE, and extensive cleaning of exam room while observing appropriate contact time as indicated for disinfecting solutions.     No follow-ups on file.  Orders Placed This Encounter  Procedures  . DG  Chest 2 View    Standing Status:   Future    Number of Occurrences:   1    Standing Expiration Date:   12/24/2021    Order Specific Question:   Reason for Exam (SYMPTOM  OR DIAGNOSIS REQUIRED)    Answer:   cough/ rule out pneumonia    Order Specific Question:   Is patient pregnant?    Answer:   No    Order Specific Question:   Preferred imaging location?    Answer:   [facility]    Requested Prescriptions   Signed Prescriptions Disp Refills  . levofloxacin (LEVAQUIN) 500 MG tablet 10 tablet 0    Sig: Take 1 tablet (500 mg total) by mouth daily for 10 days.  Marland Kitchen albuterol (VENTOLIN HFA) 108 (90 Base) MCG/ACT inhaler 8 g 1    Sig: Inhale 2 puffs into the lungs every 6 (six) hours as needed for wheezing or shortness of breath.

## 2021-01-29 ENCOUNTER — Other Ambulatory Visit: Payer: Self-pay | Admitting: Family Medicine

## 2021-03-04 ENCOUNTER — Telehealth: Payer: BC Managed Care – PPO | Admitting: Physician Assistant

## 2021-03-04 DIAGNOSIS — J020 Streptococcal pharyngitis: Secondary | ICD-10-CM | POA: Diagnosis not present

## 2021-03-05 MED ORDER — CEPHALEXIN 500 MG PO CAPS: 500.0000 mg | ORAL_CAPSULE | Freq: Two times a day (BID) | ORAL | 0 refills | Status: DC

## 2021-03-05 NOTE — Progress Notes (Signed)
E-Visit for Sore Throat - Strep Symptoms  We are sorry that you are not feeling well.  Here is how we plan to help!  Based on what you have shared with me it is likely that you have strep pharyngitis.  Strep pharyngitis is inflammation and infection in the back of the throat.  This is an infection cause by bacteria and is treated with antibiotics.  I have prescribed Cephalexin 500 mg twice a day for 10 days. For throat pain, we recommend over the counter oral pain relief medications such as acetaminophen or aspirin, or anti-inflammatory medications such as ibuprofen or naproxen sodium. Topical treatments such as oral throat lozenges or sprays may be used as needed. Strep infections are not as easily transmitted as other respiratory infections, however we still recommend that you avoid close contact with loved ones, especially the very young and elderly.  Remember to wash your hands thoroughly throughout the day as this is the number one way to prevent the spread of infection and wipe down door knobs and counters with disinfectant.   Home Care: Only take medications as instructed by your medical team. Complete the entire course of an antibiotic. Do not take these medications with alcohol. A steam or ultrasonic humidifier can help congestion.  You can place a towel over your head and breathe in the steam from hot water coming from a faucet. Avoid close contacts especially the very young and the elderly. Cover your mouth when you cough or sneeze. Always remember to wash your hands.  Get Help Right Away If: You develop worsening fever or sinus pain. You develop a severe head ache or visual changes. Your symptoms persist after you have completed your treatment plan.  Make sure you Understand these instructions. Will watch your condition. Will get help right away if you are not doing well or get worse.   Thank you for choosing an e-visit.  Your e-visit answers were reviewed by a board  certified advanced clinical practitioner to complete your personal care plan. Depending upon the condition, your plan could have included both over the counter or prescription medications.  Please review your pharmacy choice. Make sure the pharmacy is open so you can pick up prescription now. If there is a problem, you may contact your provider through MyChart messaging and have the prescription routed to another pharmacy.  Your safety is important to us. If you have drug allergies check your prescription carefully.   For the next 24 hours you can use MyChart to ask questions about today's visit, request a non-urgent call back, or ask for a work or school excuse. You will get an email in the next two days asking about your experience. I hope that your e-visit has been valuable and will speed your recovery.  I provided 5 minutes of non face-to-face time during this encounter for chart review and documentation.   

## 2021-03-08 ENCOUNTER — Other Ambulatory Visit: Payer: Self-pay

## 2021-03-08 ENCOUNTER — Encounter: Payer: Self-pay | Admitting: Family Medicine

## 2021-03-08 ENCOUNTER — Telehealth (INDEPENDENT_AMBULATORY_CARE_PROVIDER_SITE_OTHER): Payer: BC Managed Care – PPO | Admitting: Family Medicine

## 2021-03-08 DIAGNOSIS — U071 COVID-19: Secondary | ICD-10-CM | POA: Diagnosis not present

## 2021-03-08 MED ORDER — BENZONATATE 100 MG PO CAPS: 100.0000 mg | ORAL_CAPSULE | Freq: Three times a day (TID) | ORAL | 0 refills | Status: DC | PRN

## 2021-03-08 MED ORDER — PREDNISONE 20 MG PO TABS: 40.0000 mg | ORAL_TABLET | Freq: Every day | ORAL | 0 refills | Status: AC

## 2021-03-08 NOTE — Progress Notes (Signed)
Chief Complaint  Patient presents with   Covid Positive   Cough    Fatigue Headache    Sore Throat    Congestion No fever    Rebekah Hanson here for URI complaints. Due to COVID-19 pandemic, we are interacting via web portal for an electronic face-to-face visit. I verified patient's ID using 2 identifiers. Patient agreed to proceed with visit via this method. Patient is at home, I am at office. Patient and I are present for visit.   Duration: 2 days  Associated symptoms: sinus congestion, rhinorrhea, ear pain, sore throat, chest tightness, myalgia, and cough Denies: sinus pain, itchy watery eyes, ear drainage, wheezing, shortness of breath, and N/V/D, fevers Treatment to date: Advil Sick contacts: Yes Tested + for covid  Past Medical History:  Diagnosis Date   Endometriosis    Hypertension    Interstitial cystitis    Objective No conversational dyspnea Age appropriate judgment and insight Nml affect and mood  COVID-19 - Plan: predniSONE (DELTASONE) 20 MG tablet, benzonatate (TESSALON) 100 MG capsule  Prednisone for the ears/eustachian tube dysfunction.  Tessalon Perles as needed.  Discussed quarantining. Continue to push fluids, practice good hand hygiene, cover mouth when coughing. F/u prn. If starting to experience replaceable fluid loss, shaking, or shortness of breath, seek immediate care. Pt voiced understanding and agreement to the plan.  Jilda Roche [place], DO 03/08/21 11:49 AM

## 2021-03-08 NOTE — Telephone Encounter (Signed)
Pt has called and is now scheduled for a virtual appointment for Covid Pos. She tested with at home test yesterday and the sx started on saturday.

## 2021-06-02 ENCOUNTER — Telehealth: Payer: Self-pay

## 2021-06-02 NOTE — Telephone Encounter (Signed)
Pt called in states that she received a bill for 03/08/21 for vv and was told to call office to fix coding for appointment from our billing department.

## 2021-06-03 NOTE — Telephone Encounter (Signed)
Contacted Rebekah Hanson in Coding and she sated the coding is correct for this claim as coding for both in office and virtual visits are the same.  I relayed this information to the patient by leaving a message on her voice mail and stated that per patient's EOB her insurance company is requiring further information from her and asked her to please reach out to them.  Patient's [employer] we have on file has been effective since 12/27/20.

## 2021-06-12 ENCOUNTER — Other Ambulatory Visit: Payer: Self-pay | Admitting: Family Medicine

## 2021-06-17 LAB — HM MAMMOGRAPHY

## 2021-06-22 ENCOUNTER — Encounter: Payer: Self-pay | Admitting: Family Medicine

## 2021-06-23 ENCOUNTER — Ambulatory Visit: Payer: BC Managed Care – PPO | Admitting: Family Medicine

## 2021-07-26 ENCOUNTER — Encounter: Payer: Self-pay | Admitting: Family Medicine

## 2021-07-26 ENCOUNTER — Ambulatory Visit (INDEPENDENT_AMBULATORY_CARE_PROVIDER_SITE_OTHER): Payer: BC Managed Care – PPO | Admitting: Family Medicine

## 2021-07-26 ENCOUNTER — Other Ambulatory Visit: Payer: Self-pay

## 2021-07-26 VITALS — BP 108/60 | HR 73 | Temp 98.2°F | Ht 67.0 in | Wt 146.2 lb

## 2021-07-26 DIAGNOSIS — L299 Pruritus, unspecified: Secondary | ICD-10-CM | POA: Diagnosis not present

## 2021-07-26 DIAGNOSIS — Z23 Encounter for immunization: Secondary | ICD-10-CM

## 2021-07-26 MED ORDER — HYDROXYZINE HCL 25 MG PO TABS: 25.0000 mg | ORAL_TABLET | Freq: Three times a day (TID) | ORAL | 2 refills | Status: DC | PRN

## 2021-07-26 MED ORDER — LEVOCETIRIZINE DIHYDROCHLORIDE 5 MG PO TABS: 5.0000 mg | ORAL_TABLET | Freq: Every evening | ORAL | 2 refills | Status: DC

## 2021-07-26 NOTE — Progress Notes (Signed)
Chief Complaint  Patient presents with   itching all over    Rebekah Hanson is a 40 y.o. female here for a skin complaint.  Duration: Chronic, comes and goes Location: hands and feet mainly Pruritic? Yes Painful? No Drainage? No New soaps/lotions/topicals/detergents? No Sick contacts? No Other associated symptoms: no fevers, redness Therapies tried thus far: Vistaril  Past Medical History:  Diagnosis Date   Endometriosis    Hypertension    Interstitial cystitis     BP 108/60   Pulse 73   Temp 98.2 F (36.8 C) (Oral)   Ht 5\' 7"  (1.702 m)   Wt 146 lb 4 oz (66.3 kg)   SpO2 97%   BMI 22.91 kg/m  Gen: awake, alert, appearing stated age Lungs: No accessory muscle use Skin: no ext lesions on hands or scaling. No drainage, erythema, TTP, fluctuance, excoriation Psych: Age appropriate judgment and insight  Pruritic dermatitis - Plan: hydrOXYzine (ATARAX/VISTARIL) 25 MG tablet, levocetirizine (XYZAL) 5 MG tablet  Need for influenza vaccination - Plan: Flu Vaccine QUAD 6+ mos PF IM (Fluarix Quad PF)  Refill Hydroxyzine which we know works. Will try daily non-sedating 2nd gen antihist.  F/u prn. The patient voiced understanding and agreement to the plan.  [place], DO 07/26/21 2:48 PM

## 2021-07-26 NOTE — Patient Instructions (Signed)
Claritin (loratadine), Allegra (fexofenadine), Zyrtec (cetirizine) which is also equivalent to Xyzal (levocetirizine); these are listed in order from weakest to strongest. Generic, and therefore cheaper, options are in the parentheses.   There are available OTC, and the generic versions, which may be cheaper, are in parentheses. Show this to a pharmacist if you have trouble finding any of these items.  Try not to scratch as this can make things worse. Avoid scented products while dealing with this. You may resume when the itchiness resolves. Cold/cool compresses can help.   Let us know if you need anything.

## 2021-10-01 ENCOUNTER — Telehealth: Payer: BC Managed Care – PPO | Admitting: Nurse Practitioner

## 2021-10-01 DIAGNOSIS — J020 Streptococcal pharyngitis: Secondary | ICD-10-CM | POA: Diagnosis not present

## 2021-10-01 MED ORDER — AZITHROMYCIN 250 MG PO TABS: ORAL_TABLET | ORAL | 0 refills | Status: DC

## 2021-10-01 NOTE — Progress Notes (Signed)
E-Visit for Sore Throat - Strep Symptoms  We are sorry that you are not feeling well.  Here is how we plan to help!  Based on what you have shared with me it is likely that you have strep pharyngitis.  Strep pharyngitis is inflammation and infection in the back of the throat.  This is an infection cause by bacteria and is treated with antibiotics.  I have prescribed Azithromycin 250 mg two tablets today and then one daily for 4 additional days. For throat pain, we recommend over the counter oral pain relief medications such as acetaminophen or aspirin, or anti-inflammatory medications such as ibuprofen or naproxen sodium. Topical treatments such as oral throat lozenges or sprays may be used as needed. Strep infections are not as easily transmitted as other respiratory infections, however we still recommend that you avoid close contact with loved ones, especially the very young and elderly.  Remember to wash your hands thoroughly throughout the day as this is the number one way to prevent the spread of infection and wipe down door knobs and counters with disinfectant.   Home Care: Only take medications as instructed by your medical team. Complete the entire course of an antibiotic. Do not take these medications with alcohol. A steam or ultrasonic humidifier can help congestion.  You can place a towel over your head and breathe in the steam from hot water coming from a faucet. Avoid close contacts especially the very young and the elderly. Cover your mouth when you cough or sneeze. Always remember to wash your hands.  Get Help Right Away If: You develop worsening fever or sinus pain. You develop a severe head ache or visual changes. Your symptoms persist after you have completed your treatment plan.  Make sure you Understand these instructions. Will watch your condition. Will get help right away if you are not doing well or get worse.   Thank you for choosing an e-visit.  Your e-visit  answers were reviewed by a board certified advanced clinical practitioner to complete your personal care plan. Depending upon the condition, your plan could have included both over the counter or prescription medications.  Please review your pharmacy choice. Make sure the pharmacy is open so you can pick up prescription now. If there is a problem, you may contact your provider through MyChart messaging and have the prescription routed to another pharmacy.  Your safety is important to us. If you have drug allergies check your prescription carefully.   For the next 24 hours you can use MyChart to ask questions about today's visit, request a non-urgent call back, or ask for a work or school excuse. You will get an email in the next two days asking about your experience. I hope that your e-visit has been valuable and will speed your recovery.  5-10 minutes spent reviewing and documenting in chart.  

## 2021-10-25 ENCOUNTER — Other Ambulatory Visit: Payer: Self-pay | Admitting: Family Medicine

## 2021-10-25 MED ORDER — VALSARTAN 80 MG PO TABS: ORAL_TABLET | ORAL | 3 refills | Status: DC

## 2021-11-01 ENCOUNTER — Encounter: Payer: Self-pay | Admitting: Family Medicine

## 2021-11-30 ENCOUNTER — Ambulatory Visit (INDEPENDENT_AMBULATORY_CARE_PROVIDER_SITE_OTHER): Payer: BC Managed Care – PPO | Admitting: Family Medicine

## 2021-11-30 ENCOUNTER — Encounter: Payer: Self-pay | Admitting: Family Medicine

## 2021-11-30 VITALS — BP 109/65 | HR 70 | Temp 98.3°F | Resp 16 | Wt 149.0 lb

## 2021-11-30 DIAGNOSIS — J40 Bronchitis, not specified as acute or chronic: Secondary | ICD-10-CM | POA: Diagnosis not present

## 2021-11-30 DIAGNOSIS — Z20818 Contact with and (suspected) exposure to other bacterial communicable diseases: Secondary | ICD-10-CM | POA: Diagnosis not present

## 2021-11-30 DIAGNOSIS — J301 Allergic rhinitis due to pollen: Secondary | ICD-10-CM | POA: Diagnosis not present

## 2021-11-30 DIAGNOSIS — J0110 Acute frontal sinusitis, unspecified: Secondary | ICD-10-CM | POA: Diagnosis not present

## 2021-11-30 MED ORDER — FLUCONAZOLE 150 MG PO TABS: 150.0000 mg | ORAL_TABLET | Freq: Once | ORAL | 1 refills | Status: AC

## 2021-11-30 MED ORDER — ALBUTEROL SULFATE HFA 108 (90 BASE) MCG/ACT IN AERS: 2.0000 | INHALATION_SPRAY | Freq: Four times a day (QID) | RESPIRATORY_TRACT | 11 refills | Status: DC | PRN

## 2021-11-30 MED ORDER — CEFDINIR 300 MG PO CAPS: 300.0000 mg | ORAL_CAPSULE | Freq: Two times a day (BID) | ORAL | 0 refills | Status: AC

## 2021-11-30 MED ORDER — PREDNISONE 20 MG PO TABS: 40.0000 mg | ORAL_TABLET | Freq: Every day | ORAL | 0 refills | Status: AC

## 2021-11-30 NOTE — Patient Instructions (Signed)
Severe allergies vs possibly developing bronchitis/sinusitis given duration and worsening of symptoms.  ?Start prednisone. If not feeling better in the next day or two, go ahead and start antibiotics. (Diflucan added if needed). Albuterol refilled. If you feel like the Claritin is not helping with allergies this season, consider switching to Zyrtec or Xyzal and adding Flonase.  ?Continue supportive measures including rest, hydration, humidifier use, steam showers, warm compresses to sinuses, warm liquids with lemon and honey, and over-the-counter cough, cold, and analgesics as needed.  ? ?

## 2021-11-30 NOTE — Progress Notes (Signed)
? ?Acute Office Visit ? ?Subjective:  ? ? Patient ID: Rebekah Hanson, female    DOB: Jan 20, 1981, 41 y.o.   MRN: [id number] ? ?Chief Complaint  ?Patient presents with  ? Allergies  ?  Complains of seasonal allergies  ? ? ?HPI ?Patient is in today for sore throat, allergies, cough.  ? ?Patient reports her allergies have really been bothering her this year. For the past week or so she has been feeling worse. She started coughing about 5 days ago (occasionally productive with some wheezing - requesting albuterol refill). Yesterday she felt significantly worse with fatigue, sore throat, headaches, cough, mild sinus pressure. She has taken multiple negative COVID tests at home. She denies any fevers, chest pain, dyspnea, nausea, vomiting, diarrhea, flu exposure.   ? ?Her children just tested positive for strep yesterday. They are leaving for a cruise on Friday. ? ? ?Past Medical History:  ?Diagnosis Date  ? Endometriosis   ? Hypertension   ? Interstitial cystitis   ? ? ?Past Surgical History:  ?Procedure Laterality Date  ? ABDOMINAL SURGERY    ? CESAREAN SECTION  2020  ? KNEE SURGERY    ? TONSILLECTOMY    ? ? ?Family History  ?Problem Relation Age of Onset  ? Rheum arthritis Mother   ? Hypertension Mother   ? Cancer Father   ? Thymic aplasia Father   ? Autoimmune disease Father   ? Cancer Brother   ?     thyroid cancer  ? Hypertension Brother   ? ? ?Social History  ? ?Socioeconomic History  ? Marital status: Married  ?  Spouse name: Not on file  ? Number of children: Not on file  ? Years of education: Not on file  ? Highest education level: Not on file  ?Occupational History  ? Not on file  ?Tobacco Use  ? Smoking status: Never  ? Smokeless tobacco: Not on file  ?Substance and Sexual Activity  ? Alcohol use: Yes  ? Drug use: Not on file  ? Sexual activity: Not on file  ?Other Topics Concern  ? Not on file  ?Social History Narrative  ? Not on file  ? ?Social Determinants of Health  ? ?Financial Resource Strain: Not  on file  ?Food Insecurity: Not on file  ?Transportation Needs: Not on file  ?Physical Activity: Not on file  ?Stress: Not on file  ?Social Connections: Not on file  ?Intimate Partner Violence: Not on file  ? ? ?Outpatient Medications Prior to Visit  ?Medication Sig Dispense Refill  ? azithromycin (ZITHROMAX Z-PAK) 250 MG tablet As directed 6 tablet 0  ? Cholecalciferol (VITAMIN D3) 125 MCG (5000 UT) CAPS Take by mouth daily.    ? escitalopram (LEXAPRO) 5 MG tablet TAKE 1 TABLET(5 MG) BY MOUTH DAILY 30 tablet 3  ? hydrOXYzine (ATARAX/VISTARIL) 25 MG tablet Take 1 tablet (25 mg total) by mouth 3 (three) times daily as needed. 90 tablet 2  ? levocetirizine (XYZAL) 5 MG tablet Take 1 tablet (5 mg total) by mouth every evening. 30 tablet 2  ? valsartan (DIOVAN) 80 MG tablet TAKE 1 TABLET(80 MG) BY MOUTH DAILY 30 tablet 3  ? ?No facility-administered medications prior to visit.  ? ? ?Allergies  ?Allergen Reactions  ? Doxycycline Photosensitivity and Rash  ?  Breaks out in rash in the sun ?Breaks out in rash in the sun ?  ? Amoxicillin Hives  ? Erythromycin Nausea And Vomiting  ? Other Other (See Comments) and Nausea  And Vomiting  ?  Mussels/Oysters ?Mussels/Oysters ?  ? ? ?Review of Systems ?All review of systems negative except what is listed in the HPI ? ?   ?Objective:  ?  ?Physical Exam ?Vitals reviewed.  ?Constitutional:   ?   Appearance: Normal appearance. She is normal weight.  ?HENT:  ?   Head: Normocephalic and atraumatic.  ?   Right Ear: Tympanic membrane normal.  ?   Left Ear: Tympanic membrane normal.  ?   Nose: Congestion present.  ?   Mouth/Throat:  ?   Mouth: Mucous membranes are moist.  ?   Pharynx: Oropharynx is clear. No oropharyngeal exudate or posterior oropharyngeal erythema.  ?Eyes:  ?   Extraocular Movements: Extraocular movements intact.  ?   Conjunctiva/sclera: Conjunctivae normal.  ?Cardiovascular:  ?   Rate and Rhythm: Normal rate and regular rhythm.  ?Pulmonary:  ?   Effort: Pulmonary effort is  normal.  ?   Breath sounds: Normal breath sounds. No wheezing, rhonchi or rales.  ?Musculoskeletal:  ?   Cervical back: Normal range of motion and neck supple. No tenderness.  ?Lymphadenopathy:  ?   Cervical: No cervical adenopathy.  ?Skin: ?   General: Skin is warm and dry.  ?Neurological:  ?   General: No focal deficit present.  ?   Mental Status: She is alert and oriented to person, place, and time. Mental status is at baseline.  ?Psychiatric:     ?   Mood and Affect: Mood normal.     ?   Behavior: Behavior normal.     ?   Thought Content: Thought content normal.     ?   Judgment: Judgment normal.  ? ? ?BP 109/65 (BP Location: Left Arm, Patient Position: Sitting, Cuff Size: Small)   Pulse 70   Temp 98.3 ?F (36.8 ?C) (Oral)   Resp 16   Wt 149 lb (67.6 kg)   SpO2 100%   BMI 23.34 kg/m?  ?Wt Readings from Last 3 Encounters:  ?11/30/21 149 lb (67.6 kg)  ?07/26/21 146 lb 4 oz (66.3 kg)  ?12/24/20 147 lb (66.7 kg)  ? ? ?Health Maintenance Due  ?Topic Date Due  ? COVID-19 Vaccine (3 - Booster for Pfizer series) 01/08/2020  ? ? ?There are no preventive care reminders to display for this patient. ? ? ?No results found for: TSH ?Lab Results  ?Component Value Date  ? WBC 6.8 04/08/2020  ? HGB 13.0 04/08/2020  ? HCT 38.4 04/08/2020  ? MCV 95.6 04/08/2020  ? PLT 266.0 04/08/2020  ? ?Lab Results  ?Component Value Date  ? NA 139 04/08/2020  ? K 4.0 04/08/2020  ? CO2 25 04/08/2020  ? GLUCOSE 81 04/08/2020  ? BUN 18 04/08/2020  ? CREATININE 0.78 04/08/2020  ? BILITOT 0.5 04/08/2020  ? ALKPHOS 61 04/08/2020  ? AST 17 04/08/2020  ? ALT 12 04/08/2020  ? PROT 7.3 04/08/2020  ? ALBUMIN 4.7 04/08/2020  ? CALCIUM 9.3 04/08/2020  ? GFR 81.54 04/08/2020  ? ?Lab Results  ?Component Value Date  ? CHOL 173 04/08/2020  ? ?Lab Results  ?Component Value Date  ? HDL 70.40 04/08/2020  ? ?Lab Results  ?Component Value Date  ? LDLCALC 88 04/08/2020  ? ?Lab Results  ?Component Value Date  ? TRIG 74.0 04/08/2020  ? ?Lab Results  ?Component  Value Date  ? CHOLHDL 2 04/08/2020  ? ?No results found for: HGBA1C ? ?   ?Assessment & Plan:  ? ?1. Bronchitis ?2.  Acute non-recurrent frontal sinusitis ?3. Strep throat exposure ?4. Seasonal allergic rhinitis due to pollen ?Strep test negative in office today. ?Severe allergies vs possibly developing bronchitis/sinusitis given duration and worsening of symptoms.  ?Start prednisone. If not feeling better in the next day or two, go ahead and start antibiotics - using cefdinir since she just had a zpak and is allergic to amoxicillin/doxy but tolerates cephalosporins well. (Diflucan added if needed). Albuterol refilled. If you feel like the Claritin is not helping with allergies this season, consider switching to Zyrtec or Xyzal and adding Flonase.  ?Continue supportive measures including rest, hydration, humidifier use, steam showers, warm compresses to sinuses, warm liquids with lemon and honey, and over-the-counter cough, cold, and analgesics as needed.  ?Patient aware of signs/symptoms requiring further/urgent evaluation. ? ?- albuterol (VENTOLIN HFA) 108 (90 Base) MCG/ACT inhaler; Inhale 2 puffs into the lungs every 6 (six) hours as needed for wheezing.  Dispense: 2 each; Refill: 11 ?- predniSONE (DELTASONE) 20 MG tablet; Take 2 tablets (40 mg total) by mouth daily with breakfast for 5 days.  Dispense: 10 tablet; Refill: 0 ?- cefdinir (OMNICEF) 300 MG capsule; Take 1 capsule (300 mg total) by mouth 2 (two) times daily for 7 days.  Dispense: 14 capsule; Refill: 0 ? ? ?Please contact office for follow-up if symptoms do not improve or worsen. Seek emergency care if symptoms become severe. ? ? ?Clayborne Dana, NP ? ?

## 2021-12-02 ENCOUNTER — Telehealth: Payer: Self-pay | Admitting: Family Medicine

## 2021-12-02 DIAGNOSIS — J4521 Mild intermittent asthma with (acute) exacerbation: Secondary | ICD-10-CM

## 2021-12-02 MED ORDER — FLUTICASONE PROPIONATE HFA 44 MCG/ACT IN AERO: 2.0000 | INHALATION_SPRAY | Freq: Two times a day (BID) | RESPIRATORY_TRACT | 12 refills | Status: DC

## 2021-12-02 NOTE — Telephone Encounter (Signed)
Pt notified , and agree'd to go to the ED if sx worsen ?

## 2021-12-02 NOTE — Telephone Encounter (Signed)
Pt is having trouble with asthma. She stated she is not SOB just wheezing a bit. She is taking her inhaler and nebulizer and it is not helping. She would like to know if anything else can be done. Please advise.  ? ?[id number] - [place],  - [address]  ? [address], [place]  [address]  ?Phone:  [phone]  Fax:  [phone]  ?

## 2021-12-28 ENCOUNTER — Other Ambulatory Visit: Payer: Self-pay | Admitting: Family Medicine

## 2021-12-28 DIAGNOSIS — L299 Pruritus, unspecified: Secondary | ICD-10-CM

## 2022-03-07 ENCOUNTER — Telehealth: Payer: Self-pay

## 2022-03-07 NOTE — Telephone Encounter (Signed)
Rebekah Hanson called to scheduled appt. With Dr. Carmelia Roller. She reports heart palpitations on and off for months. Now every day. She denies any other symptoms, no chest pain, no dizziness or SOB. She is on vacation and appointment was set up for 03-17-22.  Rebekah Hanson advised if she develops any other symptoms or palpitations persist she needs in person evaluation.  Please advise of any other recommendations

## 2022-03-07 NOTE — Telephone Encounter (Signed)
Called the patient and offered her appt for 03/08/22 at 10 AM, but she is currently at the [facility] until Saturday. She flies out on Sunday for [place]/ and returns home on 03/16/22///has appt. With PCP on 03/17/22. She stated she has had this problem for years, but it has definitely worsened over the past few days. I instructed the patient [place] since would be well over a week before seeing PCP.  She will keep 7/20 appt as a followup from UC. The patient verbalized understanding/[place].

## 2022-03-17 ENCOUNTER — Ambulatory Visit (INDEPENDENT_AMBULATORY_CARE_PROVIDER_SITE_OTHER): Payer: BC Managed Care – PPO | Admitting: Family Medicine

## 2022-03-17 ENCOUNTER — Other Ambulatory Visit: Payer: Self-pay | Admitting: Family Medicine

## 2022-03-17 ENCOUNTER — Encounter: Payer: Self-pay | Admitting: Family Medicine

## 2022-03-17 VITALS — BP 109/66 | HR 58 | Temp 98.6°F | Resp 16 | Wt 151.0 lb

## 2022-03-17 DIAGNOSIS — R5383 Other fatigue: Secondary | ICD-10-CM

## 2022-03-17 DIAGNOSIS — R002 Palpitations: Secondary | ICD-10-CM

## 2022-03-17 LAB — COMPREHENSIVE METABOLIC PANEL
ALT: 12 U/L (ref 0–35)
AST: 17 U/L (ref 0–37)
Albumin: 4.6 g/dL (ref 3.5–5.2)
Alkaline Phosphatase: 49 U/L (ref 39–117)
BUN: 16 mg/dL (ref 6–23)
CO2: 28 mEq/L (ref 19–32)
Calcium: 9.5 mg/dL (ref 8.4–10.5)
Chloride: 103 mEq/L (ref 96–112)
Creatinine, Ser: 0.8 mg/dL (ref 0.40–1.20)
GFR: 90.79 mL/min (ref >60.00)
Glucose, Bld: 89 mg/dL (ref 70–99)
Potassium: 4.3 mEq/L (ref 3.5–5.1)
Sodium: 140 mEq/L (ref 135–145)
Total Bilirubin: 0.5 mg/dL (ref 0.2–1.2)
Total Protein: 7 g/dL (ref 6.0–8.3)

## 2022-03-17 LAB — CBC
HCT: 40.8 % (ref 36.0–46.0)
Hemoglobin: 13.7 g/dL (ref 12.0–15.0)
MCHC: 33.5 g/dL (ref 30.0–36.0)
MCV: 94.7 fl (ref 78.0–100.0)
Platelets: 246 10*3/uL (ref 150.0–400.0)
RBC: 4.31 Mil/uL (ref 3.87–5.11)
RDW: 13.8 % (ref 11.5–15.5)
WBC: 6.7 10*3/uL (ref 4.0–10.5)

## 2022-03-17 LAB — VITAMIN D 25 HYDROXY (VIT D DEFICIENCY, FRACTURES): VITD: 36.58 ng/mL (ref 30.00–100.00)

## 2022-03-17 NOTE — Patient Instructions (Addendum)
Stay hydrated.  Give [place] 2-3 business days to get the results of your labs back.   If you would like to see a cardiologist, let me know. I don't think this is necessary.   Let us know if you need anything.

## 2022-03-17 NOTE — Progress Notes (Addendum)
Chief Complaint  Patient presents with   Palpitations    Patient reports having palpitations last week.    Fatigue    Complains of fatigue    Rebekah Hanson is a 41 y.o. female here for palpitations.  Length of issue: 1 week How long does it last: 5 minutes Light headedness/passing out? No Chest pain/shortness of breath? No Hx of arrhythmia? No Medication changes/illicit substances? No  Fatigue For the past several months, the patient has been having worsening fatigue.  She gets around 6-7 hours of sleep nightly.  She wakes up and does not feel refreshed at all.  She is unsure if she snores.  Her father had sleep apnea despite not being significantly obese.  She has been more stressed out since he passed away in 2023/09/01.  She had a thyroid panel done in March that was unremarkable.  Diet and exercise have remained unchanged as she works out religiously 5 days weekly.  Past Medical History:  Diagnosis Date   Endometriosis    Hypertension    Interstitial cystitis    Past Surgical History:  Procedure Laterality Date   ABDOMINAL SURGERY     CESAREAN SECTION  2020   KNEE SURGERY     TONSILLECTOMY     Allergies  Allergen Reactions   Doxycycline Photosensitivity and Rash    Breaks out in rash in the sun Breaks out in rash in the sun    Amoxicillin Hives   Erythromycin Nausea And Vomiting   Other Other (See Comments) and Nausea And Vomiting    Mussels/Oysters Mussels/Oysters    Allergies as of 03/17/2022       Reactions   Doxycycline Photosensitivity, Rash   Breaks out in rash in the sun Breaks out in rash in the sun   Amoxicillin Hives   Erythromycin Nausea And Vomiting   Other Other (See Comments), Nausea And Vomiting   Mussels/Oysters Mussels/Oysters        Medication List        Accurate as of March 17, 2022  8:59 AM. If you have any questions, ask your nurse or doctor.          STOP taking these medications    azithromycin 250 MG  tablet Commonly known as: Zithromax Z-Pak Stopped by: Sharlene Dory, DO   levocetirizine 5 MG tablet Commonly known as: XYZAL Stopped by: Sharlene Dory, DO   Vitamin D3 125 MCG (5000 UT) Caps Stopped by: Sharlene Dory, DO       TAKE these medications    albuterol 108 (90 Base) MCG/ACT inhaler Commonly known as: VENTOLIN HFA Inhale 2 puffs into the lungs every 6 (six) hours as needed for wheezing.   escitalopram 10 MG tablet Commonly known as: LEXAPRO Take 10 mg by mouth daily. What changed: Another medication with the same name was removed. Continue taking this medication, and follow the directions you see here. Changed by: Sharlene Dory, DO   fluticasone 44 MCG/ACT inhaler Commonly known as: Flovent HFA Inhale 2 puffs into the lungs in the morning and at bedtime.   hydrOXYzine 25 MG tablet Commonly known as: ATARAX TAKE 1 TABLET BY MOUTH THREE TIMES A DAY AS NEEDED   valsartan 80 MG tablet Commonly known as: DIOVAN TAKE 1 TABLET(80 MG) BY MOUTH DAILY        BP 109/66 (BP Location: Left Arm, Patient Position: Sitting, Cuff Size: Small)   Pulse (!) 58   Temp 98.6 F (37 C) (Oral)  Resp 16   Wt 151 lb (68.5 kg)   SpO2 100%   BMI 23.65 kg/m  Gen: Awake, alert, appearing stated age Eyes: PERRLA Mouth: MMM, Mallampati 3 Heart: RRR, no bruits, no LE edema Lungs: CTAB, no accessory muscle use Neuro: No cerebellar signs MSK: No muscle group atrophy or asymmetry Psych: Age appropriate judgment and insight, mood/affect WNL  Fatigue, unspecified type - Plan: CBC, Comprehensive metabolic panel, VITAMIN D 25 Hydroxy (Vit-D Deficiency, Fractures)  Palpitation  New problem with uncertain prognosis.  Check above labs.  If normal, will refer to pulmonology for sleep evaluation.  Could consider treatment for stress/anxiety but would want to rule out sleep apnea first. Seem to have resolved.  If she starts having symptoms again, she  will let me know and we will refer her to cardiology. The patient voiced understanding and agreement to the plan.  Rebekah Hanson 8:59 AM 03/17/22

## 2022-03-29 ENCOUNTER — Encounter: Payer: Self-pay | Admitting: Family Medicine

## 2022-03-30 MED ORDER — VALSARTAN 80 MG PO TABS
ORAL_TABLET | ORAL | 3 refills | Status: DC
Start: 2022-03-30 — End: 2022-07-23

## 2022-04-17 ENCOUNTER — Encounter: Payer: Self-pay | Admitting: Family Medicine

## 2022-04-18 MED ORDER — ESCITALOPRAM OXALATE 10 MG PO TABS: 10.0000 mg | ORAL_TABLET | Freq: Every day | ORAL | 3 refills | Status: DC

## 2022-05-11 ENCOUNTER — Ambulatory Visit (INDEPENDENT_AMBULATORY_CARE_PROVIDER_SITE_OTHER): Payer: BC Managed Care – PPO | Admitting: Family Medicine

## 2022-05-11 ENCOUNTER — Encounter: Payer: Self-pay | Admitting: Family Medicine

## 2022-05-11 VITALS — BP 110/68 | HR 75 | Temp 98.4°F | Ht 67.0 in | Wt 150.4 lb

## 2022-05-11 DIAGNOSIS — Z0000 Encounter for general adult medical examination without abnormal findings: Secondary | ICD-10-CM

## 2022-05-11 NOTE — Patient Instructions (Signed)
Give us 2-3 business days to get the results of your labs back.   Keep the diet clean and stay active.  I recommend getting the flu shot in mid October. This suggestion would change if the CDC comes out with a different recommendation.   Please get me a copy of your advanced directive form at your convenience.   Let us know if you need anything.  

## 2022-05-11 NOTE — Progress Notes (Signed)
Chief Complaint  Patient presents with   Annual Exam     Well Woman Rebekah Hanson is here for a complete physical.   Her last physical was >1 year ago.  Current diet: in general, a "healthy" diet. Current exercise: cycling lifting wts. Weight is stable and she denies fatigue out of ordinary. Seatbelt? Yes Advanced directive? Yes  Health Maintenance Pap/HPV- Yes Mammogram- Yes Tetanus- Yes Hep C screening- Yes HIV screening- Yes  Past Medical History:  Diagnosis Date   Endometriosis    Hypertension    Interstitial cystitis      Past Surgical History:  Procedure Laterality Date   ABDOMINAL SURGERY     CESAREAN SECTION  2020   KNEE SURGERY     TONSILLECTOMY      Medications  Current Outpatient Medications on File Prior to Visit  Medication Sig Dispense Refill   escitalopram (LEXAPRO) 10 MG tablet Take 1 tablet (10 mg total) by mouth daily. 30 tablet 3   hydrOXYzine (ATARAX) 25 MG tablet TAKE 1 TABLET BY MOUTH THREE TIMES A DAY AS NEEDED 90 tablet 2   valsartan (DIOVAN) 80 MG tablet TAKE 1 TABLET(80 MG) BY MOUTH DAILY 30 tablet 3   Allergies Allergies  Allergen Reactions   Doxycycline Photosensitivity and Rash    Breaks out in rash in the sun Breaks out in rash in the sun    Amoxicillin Hives   Erythromycin Nausea And Vomiting   Other Other (See Comments) and Nausea And Vomiting    Mussels/Oysters Mussels/Oysters     Review of Systems: Constitutional:  no unexpected weight changes Eye:  no recent significant change in vision Ear/Nose/Mouth/Throat:  Ears:  no recent change in hearing Nose/Mouth/Throat:  no complaints of nasal congestion, no sore throat Cardiovascular: no chest pain Respiratory:  no shortness of breath Gastrointestinal:  no abdominal pain, no change in bowel habits GU:  Female: negative for dysuria or pelvic pain Musculoskeletal/Extremities:  no pain of the joints Integumentary (Skin/Breast):  no abnormal skin lesions  reported Neurologic:  no headaches Endocrine:  denies fatigue Hematologic/Lymphatic:  No areas of easy bleeding  Exam BP 110/68   Pulse 75   Temp 98.4 F (36.9 C) (Oral)   Ht 5\' 7"  (1.702 m)   Wt 150 lb 6 oz (68.2 kg)   SpO2 99%   BMI 23.55 kg/m  General:  well developed, well nourished, in no apparent distress Skin:  no significant moles, warts, or growths Head:  no masses, lesions, or tenderness Eyes:  pupils equal and round, sclera anicteric without injection Ears:  canals without lesions, TMs shiny without retraction, no obvious effusion, no erythema Nose:  nares patent, septum midline, mucosa normal, and no drainage or sinus tenderness Throat/Pharynx:  lips and gingiva without lesion; tongue and uvula midline; non-inflamed pharynx; no exudates or postnasal drainage Neck: neck supple without adenopathy, thyromegaly, or masses Lungs:  clear to auscultation, breath sounds equal bilaterally, no respiratory distress Cardio:  regular rate and rhythm, no LE edema Abdomen:  abdomen soft, nontender; bowel sounds normal; no masses or organomegaly Genital: Defer to GYN Musculoskeletal:  symmetrical muscle groups noted without atrophy or deformity Extremities:  no clubbing, cyanosis, or edema, no deformities, no skin discoloration Neuro:  gait normal; deep tendon reflexes normal and symmetric Psych: well oriented with normal range of affect and appropriate judgment/insight  Assessment and Plan  Well adult exam - Plan: CBC, Comprehensive metabolic panel, Lipid panel   Well 41 y.o. female. Counseled on diet and  exercise. Advanced directive form requested today.  Other orders as above. Follow up in 6 mo or prn. The patient voiced understanding and agreement to the plan.  Jilda Roche [place], DO 05/11/22 3:00 PM

## 2022-05-12 LAB — COMPREHENSIVE METABOLIC PANEL
ALT: 12 U/L (ref 0–35)
AST: 17 U/L (ref 0–37)
Albumin: 4.5 g/dL (ref 3.5–5.2)
Alkaline Phosphatase: 48 U/L (ref 39–117)
BUN: 14 mg/dL (ref 6–23)
CO2: 26 mEq/L (ref 19–32)
Calcium: 9.6 mg/dL (ref 8.4–10.5)
Chloride: 103 mEq/L (ref 96–112)
Creatinine, Ser: 0.77 mg/dL (ref 0.40–1.20)
GFR: 94.95 mL/min (ref >60.00)
Glucose, Bld: 75 mg/dL (ref 70–99)
Potassium: 3.9 mEq/L (ref 3.5–5.1)
Sodium: 138 mEq/L (ref 135–145)
Total Bilirubin: 0.5 mg/dL (ref 0.2–1.2)
Total Protein: 7.1 g/dL (ref 6.0–8.3)

## 2022-05-12 LAB — CBC
HCT: 37.7 % (ref 36.0–46.0)
Hemoglobin: 12.8 g/dL (ref 12.0–15.0)
MCHC: 34 g/dL (ref 30.0–36.0)
MCV: 94.8 fl (ref 78.0–100.0)
Platelets: 247 10*3/uL (ref 150.0–400.0)
RBC: 3.98 Mil/uL (ref 3.87–5.11)
RDW: 13.5 % (ref 11.5–15.5)
WBC: 8.1 10*3/uL (ref 4.0–10.5)

## 2022-05-12 LAB — LIPID PANEL
Cholesterol: 170 mg/dL (ref 0–200)
HDL: 70.7 mg/dL (ref >39.00)
LDL Cholesterol: 86 mg/dL (ref 0–99)
NonHDL: 99.58
Total CHOL/HDL Ratio: 2
Triglycerides: 66 mg/dL (ref 0.0–149.0)
VLDL: 13.2 mg/dL (ref 0.0–40.0)

## 2022-05-20 ENCOUNTER — Encounter: Payer: Self-pay | Admitting: Family Medicine

## 2022-05-20 ENCOUNTER — Ambulatory Visit (INDEPENDENT_AMBULATORY_CARE_PROVIDER_SITE_OTHER): Payer: BC Managed Care – PPO | Admitting: Family Medicine

## 2022-05-20 VITALS — BP 98/60 | HR 77 | Temp 97.8°F | Resp 18 | Ht 67.0 in | Wt 153.6 lb

## 2022-05-20 DIAGNOSIS — J209 Acute bronchitis, unspecified: Secondary | ICD-10-CM

## 2022-05-20 MED ORDER — FLUCONAZOLE 150 MG PO TABS: ORAL_TABLET | ORAL | 0 refills | Status: DC

## 2022-05-20 MED ORDER — PREDNISONE 20 MG PO TABS: 40.0000 mg | ORAL_TABLET | Freq: Every day | ORAL | 0 refills | Status: AC

## 2022-05-20 MED ORDER — AZITHROMYCIN 250 MG PO TABS: ORAL_TABLET | ORAL | 0 refills | Status: DC

## 2022-05-20 NOTE — Patient Instructions (Signed)
Continue to push fluids, practice good hand hygiene, and cover your mouth if you cough.  If you start having fevers, shaking or shortness of breath, seek immediate care.  OK to take Tylenol 1000 mg (2 extra strength tabs) or 975 mg (3 regular strength tabs) every 6 hours as needed.  Start with the prednisone. If no improvement in 2 d, take the Zpak.   Let us know if you need anything.

## 2022-05-20 NOTE — Progress Notes (Signed)
Chief Complaint  Patient presents with   covid negative    Coughing/ wheezing after not taking her Allergy medication for a while.     Rebekah Hanson here for URI complaints.  Duration: 8 days  Associated symptoms: wheezing and coughing Denies: sinus congestion, sinus pain, rhinorrhea, itchy watery eyes, ear pain, ear drainage, sore throat, shortness of breath, myalgia, and fevers Treatment to date: Advil, SABA Sick contacts: No Tested neg covid x 5.   Past Medical History:  Diagnosis Date   Endometriosis    Hypertension    Interstitial cystitis     Objective BP 98/60 (BP Location: Left Arm, Patient Position: Sitting, Cuff Size: Normal)   Pulse 77   Temp 97.8 F (36.6 C) (Temporal)   Resp 18   Ht 5\' 7"  (1.702 m)   Wt 153 lb 9.6 oz (69.7 kg)   SpO2 97%   BMI 24.06 kg/m  General: Awake, alert, appears stated age HEENT: AT, [place], ears patent b/l and TM's neg, nares patent w/o discharge, pharynx pink and without exudates, MMM Neck: No masses or asymmetry Heart: RRR Lungs: CTAB, no accessory muscle use Psych: Age appropriate judgment and insight, normal mood and affect  Acute wheezy bronchitis - Plan: predniSONE (DELTASONE) 20 MG tablet, azithromycin (ZITHROMAX) 250 MG tablet, fluconazole (DIFLUCAN) 150 MG tablet  5 d pred burst 40 mg/d. If no improvement in 2 d, will start pak. Continue to push fluids, practice good hand hygiene, cover mouth when coughing. F/u prn. If starting to experience fevers, shaking, or shortness of breath, seek immediate care. Pt voiced understanding and agreement to the plan.  [place], DO 05/20/22 12:08 PM

## 2022-05-26 ENCOUNTER — Encounter: Payer: Self-pay | Admitting: Family Medicine

## 2022-05-26 ENCOUNTER — Other Ambulatory Visit: Payer: Self-pay | Admitting: Family Medicine

## 2022-05-26 DIAGNOSIS — J209 Acute bronchitis, unspecified: Secondary | ICD-10-CM

## 2022-05-26 MED ORDER — AZITHROMYCIN 250 MG PO TABS: ORAL_TABLET | ORAL | 0 refills | Status: AC

## 2022-05-27 ENCOUNTER — Encounter: Payer: Self-pay | Admitting: Family Medicine

## 2022-05-31 NOTE — Telephone Encounter (Signed)
Opened in error

## 2022-07-22 ENCOUNTER — Other Ambulatory Visit: Payer: Self-pay | Admitting: Family Medicine

## 2022-08-28 ENCOUNTER — Other Ambulatory Visit: Payer: Self-pay | Admitting: Family Medicine

## 2022-08-28 DIAGNOSIS — L308 Other specified dermatitis: Secondary | ICD-10-CM

## 2022-09-29 ENCOUNTER — Encounter (INDEPENDENT_AMBULATORY_CARE_PROVIDER_SITE_OTHER): Payer: BC Managed Care – PPO | Admitting: Family Medicine

## 2022-09-29 DIAGNOSIS — H1031 Unspecified acute conjunctivitis, right eye: Secondary | ICD-10-CM | POA: Diagnosis not present

## 2022-09-29 MED ORDER — TOBRAMYCIN 0.3 % OP SOLN: 1.0000 [drp] | OPHTHALMIC | 0 refills | Status: DC

## 2022-09-29 NOTE — Telephone Encounter (Signed)

## 2022-09-30 ENCOUNTER — Encounter: Payer: Self-pay | Admitting: Family Medicine

## 2022-10-21 ENCOUNTER — Ambulatory Visit: Payer: BC Managed Care – PPO | Admitting: Family Medicine

## 2022-10-21 ENCOUNTER — Encounter: Payer: Self-pay | Admitting: Family Medicine

## 2022-10-21 VITALS — BP 110/60 | HR 81 | Temp 98.4°F | Ht 67.0 in | Wt 157.4 lb

## 2022-10-21 DIAGNOSIS — F411 Generalized anxiety disorder: Secondary | ICD-10-CM

## 2022-10-21 MED ORDER — HYDROXYZINE HCL 25 MG PO TABS: 25.0000 mg | ORAL_TABLET | Freq: Three times a day (TID) | ORAL | 2 refills | Status: DC | PRN

## 2022-10-21 NOTE — Progress Notes (Signed)
Chief Complaint  Patient presents with   Anxiety    Heart palpitations     Rebekah Hanson is a 42 y.o. female here for palpitations.  Length of issue: 2 months Her husband got fired from his job several months ago.  She had to take a second job and is still raising 3 children.  When she works her second job, it is usually in the evening and that is when she gets her palpitations.  When she tries to tell herself to calm down or stops working, the palpitations improved.  She has associated insomnia and racing thoughts, difficulty concentrating. How long does it last: several minutes Light headedness/passing out? No Chest pain/shortness of breath? No Hx of arrhythmia? No Medication changes/illicit substances? No She does take Lexapro 10 mg/d for anxiety.  Past Medical History:  Diagnosis Date   Endometriosis    Hypertension    Interstitial cystitis    Past Surgical History:  Procedure Laterality Date   ABDOMINAL SURGERY     CESAREAN SECTION  2020   KNEE SURGERY     TONSILLECTOMY     Allergies  Allergen Reactions   Doxycycline Photosensitivity and Rash    Breaks out in rash in the sun Breaks out in rash in the sun    Amoxicillin Hives   Erythromycin Nausea And Vomiting   Other Other (See Comments) and Nausea And Vomiting    Mussels/Oysters Mussels/Oysters    Allergies as of 10/21/2022       Reactions   Doxycycline Photosensitivity, Rash   Breaks out in rash in the sun Breaks out in rash in the sun   Amoxicillin Hives   Erythromycin Nausea And Vomiting   Other Other (See Comments), Nausea And Vomiting   Mussels/Oysters Mussels/Oysters        Medication List        Accurate as of October 21, 2022 10:41 AM. If you have any questions, ask your nurse or doctor.          STOP taking these medications    fluconazole 150 MG tablet Commonly known as: DIFLUCAN Stopped by: Shelda Pal, DO       TAKE these medications    escitalopram 10  MG tablet Commonly known as: LEXAPRO TAKE 1 TABLET BY MOUTH EVERY DAY   hydrOXYzine 25 MG tablet Commonly known as: ATARAX Take 1-3 tablets (25-75 mg total) by mouth 3 (three) times daily as needed for anxiety. What changed:  how much to take reasons to take this Changed by: Shelda Pal, DO   tobramycin 0.3 % ophthalmic solution Commonly known as: Tobrex Place 1 drop into the right eye every 4 (four) hours.   valsartan 80 MG tablet Commonly known as: DIOVAN TAKE 1 TABLET(80 MG) BY MOUTH DAILY        BP 110/60 (BP Location: Left Arm, Patient Position: Sitting, Cuff Size: Normal)   Pulse 81   Temp 98.4 F (36.9 C) (Oral)   Ht '5\' 7"'$  (1.702 m)   Wt 157 lb 6 oz (71.4 kg)   SpO2 98%   BMI 24.65 kg/m  Gen: Awake, alert, appearing stated age Heart: RRR, no bruits, no LE edema Lungs: CTAB, no accessory muscle use Neuro: No cerebellar signs Psych: Age appropriate judgment and insight, mood/affect WNL  GAD (generalized anxiety disorder) - Plan: hydrOXYzine (ATARAX) 25 MG tablet  Chronic, not stable.  Continue with counseling.  Continue Lexapro 10 mg daily.  We did discuss increasing which she politely declined.  She will start taking higher doses of Atarax as she has only been using 25 mg per dose.  Counseled on exercise.  I think she will be able to do this more once she sleeps better.  Follow-up in 6 weeks to recheck. The patient voiced understanding and agreement to the plan.  Crosby Oyster Ghazal Pevey 10:41 AM 10/21/22

## 2022-10-21 NOTE — Patient Instructions (Addendum)
Aim to do some physical exertion for 150 minutes per week. This is typically divided into 5 days per week, 30 minutes per day. The activity should be enough to get your heart rate up. Anything is better than nothing if you have time constraints.  You can take up to 4 tabs at a time of the hydroxyzine.   Send me a message if you don't have success with the higher dosages.   Let us know if you need anything.

## 2022-10-24 ENCOUNTER — Encounter: Payer: Self-pay | Admitting: Family Medicine

## 2022-10-25 ENCOUNTER — Other Ambulatory Visit: Payer: Self-pay | Admitting: Family Medicine

## 2022-10-25 MED ORDER — ALPRAZOLAM 0.5 MG PO TABS: 0.2500 mg | ORAL_TABLET | Freq: Every day | ORAL | 0 refills | Status: DC | PRN

## 2022-11-21 ENCOUNTER — Other Ambulatory Visit: Payer: Self-pay | Admitting: Family Medicine

## 2022-11-24 ENCOUNTER — Other Ambulatory Visit: Payer: Self-pay | Admitting: Family Medicine

## 2022-11-24 DIAGNOSIS — L308 Other specified dermatitis: Secondary | ICD-10-CM

## 2022-12-02 ENCOUNTER — Ambulatory Visit: Payer: BC Managed Care – PPO | Admitting: Family Medicine

## 2022-12-04 ENCOUNTER — Encounter: Payer: Self-pay | Admitting: Family Medicine

## 2022-12-05 MED ORDER — VALSARTAN 80 MG PO TABS: 80.0000 mg | ORAL_TABLET | Freq: Every day | ORAL | 3 refills | Status: DC

## 2022-12-13 ENCOUNTER — Ambulatory Visit: Payer: BC Managed Care – PPO | Admitting: Family Medicine

## 2022-12-16 ENCOUNTER — Ambulatory Visit: Payer: BC Managed Care – PPO | Admitting: Family Medicine

## 2022-12-16 VITALS — BP 108/70 | HR 66 | Temp 98.2°F | Ht 67.0 in | Wt 159.0 lb

## 2022-12-16 DIAGNOSIS — F411 Generalized anxiety disorder: Secondary | ICD-10-CM | POA: Diagnosis not present

## 2022-12-16 DIAGNOSIS — L659 Nonscarring hair loss, unspecified: Secondary | ICD-10-CM

## 2022-12-16 LAB — TSH: TSH: 1.14 u[IU]/mL (ref 0.35–5.50)

## 2022-12-16 LAB — CBC
HCT: 38.6 % (ref 36.0–46.0)
Hemoglobin: 13.2 g/dL (ref 12.0–15.0)
MCHC: 34.1 g/dL (ref 30.0–36.0)
MCV: 92.7 fl (ref 78.0–100.0)
Platelets: 263 10*3/uL (ref 150.0–400.0)
RBC: 4.17 Mil/uL (ref 3.87–5.11)
RDW: 13.4 % (ref 11.5–15.5)
WBC: 6.4 10*3/uL (ref 4.0–10.5)

## 2022-12-16 LAB — IBC + FERRITIN
Ferritin: 12.8 ng/mL (ref 10.0–291.0)
Iron: 113 ug/dL (ref 42–145)
Saturation Ratios: 25.7 % (ref 20.0–50.0)
TIBC: 439.6 ug/dL (ref 250.0–450.0)
Transferrin: 314 mg/dL (ref 212.0–360.0)

## 2022-12-16 LAB — T4, FREE: Free T4: 0.71 ng/dL (ref 0.60–1.60)

## 2022-12-16 MED ORDER — BUSPIRONE HCL 7.5 MG PO TABS: 7.5000 mg | ORAL_TABLET | Freq: Two times a day (BID) | ORAL | 1 refills | Status: DC

## 2022-12-16 NOTE — Patient Instructions (Signed)
Give [place] 2-3 business days to get the results of your labs back.   Stay active, consider adding back 1 tough cycling ride per week and increase from there.   Let us know if you need anything.

## 2022-12-16 NOTE — Progress Notes (Signed)
Chief Complaint  Patient presents with   Anxiety    Subjective Rebekah Hanson presents for f/u anxiety.  Pt is currently being treated with Lexapro 10 mg/d, Xanax 0.25-0.5 mg QD prn.  Latter does help with anxiety when it happens.  Reports some improvement since treatment. No thoughts of harming self or others. No self-medication with alcohol, prescription drugs or illicit drugs. Has been walking. Pt is following with a counselor/psychologist.  Past Medical History:  Diagnosis Date   Endometriosis    Hypertension    Interstitial cystitis    Exam BP 108/70 (BP Location: Left Arm, Patient Position: Sitting, Cuff Size: Normal)   Pulse 66   Temp 98.2 F (36.8 C) (Oral)   Ht  (1.702 m)   Wt 159 lb (72.1 kg)   SpO2 99%   BMI 24.90 kg/m  General:  well developed, well nourished, in no apparent distress Lungs:  No respiratory distress Skin: Hair without focal thinning, scalp without erythema, scaling, or patches of alopecia Psych: well oriented with normal range of affect and age-appropriate judgement/insight, alert and oriented x4.  Assessment and Plan  GAD (generalized anxiety disorder) - Plan: busPIRone (BUSPAR) 7.5 MG tablet  Hair thinning - Plan: CBC, IBC + Ferritin, TSH, T4, free  Chronic, unstable. Cont Lexapro 10 mg/d, Xanax 0.25-0.5 mg prn.  Add BuSpar 7.5 mg twice daily.  Continue with counseling team.  Counseled on exercise.  Recommended she added a high intensity cycling workout once per week and go from there. F/u in 1 month to recheck. Probably secondary to #1.  No obvious patches of alopecia or signs of infection.  Rule out metabolic causes as above.  Keep protein intake high. The patient voiced understanding and agreement to the plan.  Jilda Roche [place], DO 12/16/22 9:27 AM

## 2022-12-30 ENCOUNTER — Telehealth: Payer: BC Managed Care – PPO | Admitting: Family Medicine

## 2022-12-30 DIAGNOSIS — B9689 Other specified bacterial agents as the cause of diseases classified elsewhere: Secondary | ICD-10-CM | POA: Diagnosis not present

## 2022-12-30 DIAGNOSIS — J019 Acute sinusitis, unspecified: Secondary | ICD-10-CM

## 2022-12-30 MED ORDER — LEVOFLOXACIN 500 MG PO TABS: 500.0000 mg | ORAL_TABLET | Freq: Every day | ORAL | 0 refills | Status: AC

## 2022-12-30 NOTE — Progress Notes (Signed)
E-Visit for Sinus Problems  We are sorry that you are not feeling well.  Here is how we plan to help!  Based on what you have shared with me it looks like you have sinusitis.  Sinusitis is inflammation and infection in the sinus cavities of the head.  Based on your presentation I believe you most likely have Acute Bacterial Sinusitis.  This is an infection caused by bacteria and is treated with antibiotics. I have prescribed Levofloxicin 500mg  by mouth once daily for 7 days. You may use an oral decongestant such as Mucinex D or if you have glaucoma or high blood pressure use plain Mucinex. Saline nasal spray help and can safely be used as often as needed for congestion.  If you develop worsening sinus pain, fever or notice severe headache and vision changes, or if symptoms are not better after completion of antibiotic, please schedule an appointment with a health care provider.    Sinus infections are not as easily transmitted as other respiratory infection, however we still recommend that you avoid close contact with loved ones, especially the very young and elderly.  Remember to wash your hands thoroughly throughout the day as this is the number one way to prevent the spread of infection!  Home Care: Only take medications as instructed by your medical team. Complete the entire course of an antibiotic. Do not take these medications with alcohol. A steam or ultrasonic humidifier can help congestion.  You can place a towel over your head and breathe in the steam from hot water coming from a faucet. Avoid close contacts especially the very young and the elderly. Cover your mouth when you cough or sneeze. Always remember to wash your hands.  Get Help Right Away If: You develop worsening fever or sinus pain. You develop a severe head ache or visual changes. Your symptoms persist after you have completed your treatment plan.  Make sure you Understand these instructions. Will watch your  condition. Will get help right away if you are not doing well or get worse.  Thank you for choosing an e-visit.  Your e-visit answers were reviewed by a board certified advanced clinical practitioner to complete your personal care plan. Depending upon the condition, your plan could have included both over the counter or prescription medications.  Please review your pharmacy choice. Make sure the pharmacy is open so you can pick up prescription now. If there is a problem, you may contact your provider through [employer] and have the prescription routed to another pharmacy.  Your safety is important to [place]. If you have drug allergies check your prescription carefully.   For the next 24 hours you can use MyChart to ask questions about today's visit, request a non-urgent call back, or ask for a work or school excuse. You will get an email in the next two days asking about your experience. I hope that your e-visit has been valuable and will speed your recovery.    have provided 5 minutes of non face to face time during this encounter for chart review and documentation.

## 2022-12-31 ENCOUNTER — Other Ambulatory Visit: Payer: Self-pay | Admitting: Family Medicine

## 2023-01-02 ENCOUNTER — Encounter: Payer: Self-pay | Admitting: Family Medicine

## 2023-01-02 ENCOUNTER — Other Ambulatory Visit: Payer: Self-pay | Admitting: Family Medicine

## 2023-01-02 MED ORDER — MINOXIDIL 2.5 MG PO TABS: 2.5000 mg | ORAL_TABLET | Freq: Every day | ORAL | 1 refills | Status: DC

## 2023-01-09 ENCOUNTER — Other Ambulatory Visit: Payer: Self-pay | Admitting: Family Medicine

## 2023-01-09 DIAGNOSIS — L308 Other specified dermatitis: Secondary | ICD-10-CM

## 2023-01-24 ENCOUNTER — Other Ambulatory Visit: Payer: Self-pay | Admitting: Family Medicine

## 2023-03-03 ENCOUNTER — Telehealth: Payer: BC Managed Care – PPO | Admitting: Nurse Practitioner

## 2023-03-03 DIAGNOSIS — J0140 Acute pansinusitis, unspecified: Secondary | ICD-10-CM | POA: Diagnosis not present

## 2023-03-03 DIAGNOSIS — R051 Acute cough: Secondary | ICD-10-CM | POA: Diagnosis not present

## 2023-03-03 MED ORDER — AZITHROMYCIN 250 MG PO TABS
ORAL_TABLET | ORAL | 0 refills | Status: AC
Start: 2023-03-03 — End: 2023-03-08

## 2023-03-03 NOTE — Progress Notes (Signed)
E-Visit for Sinus Problems  We are sorry that you are not feeling well.  Here is how we plan to help!  Based on what you have shared with me it looks like you have sinusitis.  Sinusitis is inflammation and infection in the sinus cavities of the head.  Based on your presentation I believe you most likely have Acute Bacterial Sinusitis.  This is an infection caused by bacteria and is treated with antibiotics. I have prescribed  Meds ordered this encounter  Medications   azithromycin (ZITHROMAX) 250 MG tablet    Sig: Take 2 tablets on day 1, then 1 tablet daily on days 2 through 5    Dispense:  6 tablet    Refill:  0   You may use an oral decongestant such as Mucinex D or if you have glaucoma or high blood pressure use plain Mucinex. Saline nasal spray help and can safely be used as often as needed for congestion.  If you develop worsening sinus pain, fever or notice severe headache and vision changes, or if symptoms are not better after completion of antibiotic, please schedule an appointment with a health care provider.    Sinus infections are not as easily transmitted as other respiratory infection, however we still recommend that you avoid close contact with loved ones, especially the very young and elderly.  Remember to wash your hands thoroughly throughout the day as this is the number one way to prevent the spread of infection!  Home Care: Only take medications as instructed by your medical team. Complete the entire course of an antibiotic. Do not take these medications with alcohol. A steam or ultrasonic humidifier can help congestion.  You can place a towel over your head and breathe in the steam from hot water coming from a faucet. Avoid close contacts especially the very young and the elderly. Cover your mouth when you cough or sneeze. Always remember to wash your hands.  Get Help Right Away If: You develop worsening fever or sinus pain. You develop a severe head ache or visual  changes. Your symptoms persist after you have completed your treatment plan.  Make sure you Understand these instructions. Will watch your condition. Will get help right away if you are not doing well or get worse.  Thank you for choosing an e-visit.  Your e-visit answers were reviewed by a board certified advanced clinical practitioner to complete your personal care plan. Depending upon the condition, your plan could have included both over the counter or prescription medications.  Please review your pharmacy choice. Make sure the pharmacy is open so you can pick up prescription now. If there is a problem, you may contact your provider through MyChart messaging and have the prescription routed to another pharmacy.  Your safety is important to us. If you have drug allergies check your prescription carefully.   For the next 24 hours you can use MyChart to ask questions about today's visit, request a non-urgent call back, or ask for a work or school excuse. You will get an email in the next two days asking about your experience. I hope that your e-visit has been valuable and will speed your recovery.   I spent approximately 5 minutes reviewing the patient's history, current symptoms and coordinating their care today.   

## 2023-05-29 ENCOUNTER — Other Ambulatory Visit: Payer: Self-pay | Admitting: Family Medicine

## 2023-05-29 ENCOUNTER — Encounter: Payer: Self-pay | Admitting: Family Medicine

## 2023-05-29 MED ORDER — ALPRAZOLAM 0.5 MG PO TABS: 0.2500 mg | ORAL_TABLET | Freq: Every day | ORAL | Status: DC | PRN

## 2023-06-27 ENCOUNTER — Ambulatory Visit: Payer: BC Managed Care – PPO | Admitting: Family Medicine

## 2023-06-27 VITALS — BP 110/74 | HR 88 | Temp 98.0°F | Resp 16 | Ht 67.0 in | Wt 153.0 lb

## 2023-06-27 DIAGNOSIS — J189 Pneumonia, unspecified organism: Secondary | ICD-10-CM

## 2023-06-27 MED ORDER — AZITHROMYCIN 250 MG PO TABS
ORAL_TABLET | ORAL | 0 refills | Status: DC
Start: 2023-06-27 — End: 2023-07-06

## 2023-06-27 MED ORDER — PROMETHAZINE-DM 6.25-15 MG/5ML PO SYRP: 5.0000 mL | ORAL_SOLUTION | Freq: Four times a day (QID) | ORAL | 0 refills | Status: DC | PRN

## 2023-06-27 MED ORDER — FLUCONAZOLE 150 MG PO TABS
ORAL_TABLET | ORAL | 0 refills | Status: DC
Start: 2023-06-27 — End: 2024-04-02

## 2023-06-27 NOTE — Progress Notes (Signed)
Chief Complaint  Patient presents with   Cough    Cough for few weeks    Rebekah Hanson here for URI complaints.  Duration: 2 weeks; was improving, worsened over past 4-5 days Associated symptoms: chest tightness and coughing Denies: sinus congestion, sinus pain, rhinorrhea, itchy watery eyes, ear pain, ear drainage, sore throat, wheezing, shortness of breath, myalgia, and fevers Treatment to date: Advil, Nyquil, cough drops Sick contacts: Yes; kids  Past Medical History:  Diagnosis Date   Endometriosis    Hypertension    Interstitial cystitis     Objective BP 110/74 (BP Location: Left Arm, Patient Position: Sitting, Cuff Size: Normal)   Pulse 88   Temp 98 F (36.7 C) (Oral)   Resp 16   Ht 5\' 7"  (1.702 m)   Wt 153 lb (69.4 kg)   SpO2 99%   BMI 23.96 kg/m  General: Awake, alert, appears stated age HEENT: AT, [place], ears patent b/l and TM's neg, nares patent w/o discharge, pharynx pink and without exudates, MMM, no TTP over sinuses Neck: No masses or asymmetry Heart: RRR Lungs: CTAB, no accessory muscle use Psych: Age appropriate judgment and insight, normal mood and affect  Walking pneumonia - Plan: azithromycin (ZITHROMAX) 250 MG tablet, fluconazole (DIFLUCAN) 150 MG tablet, promethazine-dextromethorphan (PROMETHAZINE-DM) 6.25-15 MG/5ML syrup  Will treat for walking pneumonia.  Zpak (which she has tolerated well previously), cough syrup as above.  Warnings about drowsiness verbalized.  She plans to take at night.  Continue to push fluids, practice good hand hygiene, cover mouth when coughing. F/u prn. If starting to experience fevers, shaking, or shortness of breath, seek immediate care. Pt voiced understanding and agreement to the plan.  Jilda Roche [place], DO 06/27/23 8:59 AM

## 2023-06-27 NOTE — Patient Instructions (Signed)
Continue to push fluids, practice good hand hygiene, and cover your mouth if you cough. ? ?If you start having fevers, shaking or shortness of breath, seek immediate care. ? ?OK to take Tylenol 1000 mg (2 extra strength tabs) or 975 mg (3 regular strength tabs) every 6 hours as needed. ? ?Let us know if you need anything. ?

## 2023-06-30 ENCOUNTER — Ambulatory Visit: Payer: BC Managed Care – PPO | Admitting: Medical

## 2023-06-30 VITALS — BP 114/66 | HR 67 | Temp 97.9°F | Resp 18 | Ht 67.0 in | Wt 150.2 lb

## 2023-06-30 DIAGNOSIS — N390 Urinary tract infection, site not specified: Secondary | ICD-10-CM

## 2023-06-30 DIAGNOSIS — R350 Frequency of micturition: Secondary | ICD-10-CM

## 2023-06-30 DIAGNOSIS — R102 Pelvic and perineal pain: Secondary | ICD-10-CM

## 2023-06-30 LAB — POCT URINALYSIS DIPSTICK
Bilirubin, UA: NEGATIVE
Glucose, UA: NEGATIVE
Ketones, UA: NEGATIVE
Leukocytes, UA: NEGATIVE
Nitrite, UA: NEGATIVE
Protein, UA: NEGATIVE
Spec Grav, UA: <=1.005 — AB (ref 1.010–1.025)
Urobilinogen, UA: 0.2 U/dL
pH, UA: 6 (ref 5.0–8.0)

## 2023-06-30 MED ORDER — CEPHALEXIN 500 MG PO CAPS: 500.0000 mg | ORAL_CAPSULE | Freq: Two times a day (BID) | ORAL | 0 refills | Status: DC

## 2023-06-30 NOTE — Patient Instructions (Signed)
Urinary frequency with suprapubic tenderness. -rx keflex antibiotic as typically zpack which you used recently not used for uti -you have difulcan to use if yeast infection signs/symptoms occur - POCT Urinalysis Dipstick - Urine Culture -will update you on culture and ask you update me on signs/symptoms at that time.  For recent walking pneumonia continue azithromycin until finished. Lungs sound clear today.  Follow up date to be determined based on study results and sign/symptom response.

## 2023-06-30 NOTE — Progress Notes (Signed)
Subjective:    Patient ID: Rebekah Hanson, female    DOB: 11/28/1980, 42 y.o.   MRN: [id number]  HPI Pt in for evaluation.  Pt recently treated for walking pneumonia.  No cxr but treated based exam.  Pt A/P Walking pneumonia - Plan: azithromycin (ZITHROMAX) 250 MG tablet, fluconazole (DIFLUCAN) 150 MG tablet, promethazine-dextromethorphan (PROMETHAZINE-DM) 6.25-15 MG/5ML syrup   Will treat for walking pneumonia.  Zpak (which she has tolerated well previously), cough syrup as above.  Warnings about drowsiness verbalized.  She plans to take at night.  Continue to push fluids, practice good hand hygiene, cover mouth when coughing. F/u prn. If starting to experience fevers, shaking, or shortness of breath, seek immediate care. Pt voiced understanding and agreement to the plan.   Pt states she feels a lot better. Much less cough and more energy. Pt states can sleep now. Only residual occasonal cough.  Pt states has some pressure over her bladder and frequent urination. Pt on her menses. Pt does report hx of uti in the past but also states hx of IC in past. No vaginal itching.           Review of Systems  Constitutional:  Negative for chills, fatigue and fever.  Respiratory:  Negative for cough, chest tightness and wheezing.        Only residual cough  Cardiovascular:  Negative for chest pain and palpitations.  Gastrointestinal:  Negative for abdominal pain, nausea and vomiting.  Genitourinary:  Positive for frequency. Negative for urgency.       See hpi.  Musculoskeletal:  Negative for back pain.  Neurological:  Negative for dizziness and light-headedness.  Hematological:  Negative for adenopathy. Does not bruise/bleed easily.    Past Medical History:  Diagnosis Date   Endometriosis    Hypertension    Interstitial cystitis      Social History   Socioeconomic History   Marital status: Married    Spouse name: Not on file   Number of children: Not on file    Years of education: Not on file   Highest education level: Master's degree (e.g., MA, MS, MEng, MEd, MSW, MBA)  Occupational History   Not on file  Tobacco Use   Smoking status: Never   Smokeless tobacco: Not on file  Substance and Sexual Activity   Alcohol use: Yes   Drug use: Not on file   Sexual activity: Not on file  Other Topics Concern   Not on file  Social History Narrative   Not on file   Social Determinants of Health   Financial Resource Strain: High Risk (06/27/2023)   Overall Financial Resource Strain (CARDIA)    Difficulty of Paying Living Expenses: Very hard  Food Insecurity: Food Insecurity Present (06/27/2023)   Hunger Vital Sign    Worried About Running Out of Food in the Last Year: Sometimes true    Ran Out of Food in the Last Year: Never true  Transportation Needs: No Transportation Needs (06/27/2023)   PRAPARE - Administrator, Civil Service (Medical): No    Lack of Transportation (Non-Medical): No  Physical Activity: Insufficiently Active (06/27/2023)   Exercise Vital Sign    Days of Exercise per Week: 2 days    Minutes of Exercise per Session: 30 min  Stress: Stress Concern Present (06/27/2023)   [employer] of Occupational Health - Occupational Stress Questionnaire    Feeling of Stress : Very much  Social Connections: Socially Integrated (06/27/2023)   Social  Connection and Isolation Panel [NHANES]    Frequency of Communication with Friends and Family: More than three times a week    Frequency of Social Gatherings with Friends and Family: Once a week    Attends Religious Services: More than 4 times per year    Active Member of [employer] or Organizations: No    Attends Engineer, structural: More than 4 times per year    Marital Status: Married  Catering manager Violence: Unknown (11/29/2021)   Received from [employer], [facility]   HITS    Physically Hurt: Not on file    Insult or Talk Down To: Not on file    Threaten  Physical Harm: Not on file    Scream or Curse: Not on file    Past Surgical History:  Procedure Laterality Date   ABDOMINAL SURGERY     CESAREAN SECTION  2020   KNEE SURGERY     TONSILLECTOMY      Family History  Problem Relation Age of Onset   Rheum arthritis Mother    Hypertension Mother    Cancer Father    Thymic aplasia Father    Autoimmune disease Father    Cancer Brother        thyroid cancer   Hypertension Brother     Allergies  Allergen Reactions   Doxycycline Photosensitivity and Rash    Breaks out in rash in the sun Breaks out in rash in the sun    Amoxicillin Hives   Erythromycin Nausea And Vomiting   Other Other (See Comments) and Nausea And Vomiting    Mussels/Oysters Mussels/Oysters     Current Outpatient Medications on File Prior to Visit  Medication Sig Dispense Refill   ALPRAZolam (XANAX) 0.5 MG tablet Take 0.5-1 tablets (0.25-0.5 mg total) by mouth daily as needed for anxiety. 30 tablet 01   azithromycin (ZITHROMAX) 250 MG tablet Take 2 tabs the first day and then 1 tab daily until you run out. 6 tablet 0   escitalopram (LEXAPRO) 10 MG tablet TAKE 1 TABLET BY MOUTH EVERY DAY 30 tablet 3   fluconazole (DIFLUCAN) 150 MG tablet Take 1 tab, repeat in 72 hours if no improvement. 2 tablet 0   minoxidil (LONITEN) 2.5 MG tablet TAKE 1 TABLET BY MOUTH EVERY DAY 90 tablet 1   promethazine-dextromethorphan (PROMETHAZINE-DM) 6.25-15 MG/5ML syrup Take 5 mLs by mouth 4 (four) times daily as needed for cough. 118 mL 0   valsartan (DIOVAN) 80 MG tablet Take 1 tablet (80 mg total) by mouth daily. 90 tablet 3   No current facility-administered medications on file prior to visit.    BP 114/66   Pulse 67   Temp 97.9 F (36.6 C)   Resp 18   Ht 5\' 7"  (1.702 m)   Wt 150 lb 3.2 oz (68.1 kg)   LMP 05/20/2023   SpO2 98%   BMI 23.52 kg/m        Objective:   Physical Exam  General- No acute distress. Pleasant patient. Neck- Full range of motion, no  jvd Lungs- Clear, even and unlabored. Heart- regular rate and rhythm. Neurologic- CNII- XII grossly intact.  Back-no cva tendernss. Abdomen- supapubic tenderness. +bs, no rebound or guarding and no organomegaly.     Assessment & Plan:   Patient Instructions  Urinary frequency with suprapubic tenderness. -rx keflex antibiotic as typically zpack which you used recently not used for uti -you have difulcan to use if yeast infection signs/symptoms occur - POCT Urinalysis  Dipstick - Urine Culture -will update you on culture and ask you update me on signs/symptoms at that time.  For recent walking pneumonia continue azithromycin until finished. Lungs sound clear today.  Follow up date to be determined based on study results and sign/symptom response.       Esperanza Richters, PA-C

## 2023-07-02 LAB — URINE CULTURE
MICRO NUMBER:: 15675197
SPECIMEN QUALITY:: ADEQUATE

## 2023-07-05 ENCOUNTER — Encounter: Payer: Self-pay | Admitting: Medical

## 2023-07-06 MED ORDER — NITROFURANTOIN MONOHYD MACRO 100 MG PO CAPS: 100.0000 mg | ORAL_CAPSULE | Freq: Two times a day (BID) | ORAL | 0 refills | Status: DC

## 2023-07-06 NOTE — Addendum Note (Signed)
Addended by: Gwenevere Abbot on: 07/06/2023 09:25 AM   Modules accepted: Orders

## 2023-07-11 ENCOUNTER — Other Ambulatory Visit: Payer: BC Managed Care – PPO

## 2023-07-11 DIAGNOSIS — N390 Urinary tract infection, site not specified: Secondary | ICD-10-CM

## 2023-07-12 LAB — URINE CULTURE
MICRO NUMBER:: 15718996
Result:: NO GROWTH
SPECIMEN QUALITY:: ADEQUATE

## 2023-07-13 ENCOUNTER — Other Ambulatory Visit: Payer: Self-pay | Admitting: Medical

## 2023-07-13 MED ORDER — ELMIRON 100 MG PO CAPS: 100.0000 mg | ORAL_CAPSULE | Freq: Three times a day (TID) | ORAL | 0 refills | Status: AC

## 2023-07-13 MED ORDER — OXYBUTYNIN CHLORIDE 5 MG PO TABS: ORAL_TABLET | ORAL | 0 refills | Status: AC

## 2023-07-13 NOTE — Telephone Encounter (Signed)
Recently treated for uti. After treatment with antibiotics culture came back no growth but pt still has symptoms. She had IC dx 5 years ago. She called to reestablish but now told new pt. She notified me and asked if I could restart form meds given by urologist. Researched chart and found 2019 urologist note. She was on elmiron and ditropan. So did go ahead and send refills of those meds pending urologist appointment. She has appt with Dr. Carmelia Roller next week. Decided not to give antibiotic presently as culture was negative.

## 2023-07-14 ENCOUNTER — Telehealth: Payer: Self-pay

## 2023-07-14 MED ORDER — HYDROXYZINE PAMOATE 25 MG PO CAPS: ORAL_CAPSULE | ORAL | 0 refills | Status: DC

## 2023-07-14 NOTE — Addendum Note (Signed)
Addended by: Gwenevere Abbot on: 07/14/2023 01:00 PM   Modules accepted: Orders

## 2023-07-14 NOTE — Telephone Encounter (Signed)
PA approved. Effective 07/14/2023 - 07/13/2024

## 2023-07-14 NOTE — Telephone Encounter (Signed)
Pharmacy comment: Alternative Requested:NOT COVERED >$1000.

## 2023-07-14 NOTE — Telephone Encounter (Signed)
PA initiated via Covermymeds; KEY: [id number]. Awaiting determination.

## 2023-07-19 ENCOUNTER — Encounter: Payer: Self-pay | Admitting: Family Medicine

## 2023-07-19 ENCOUNTER — Ambulatory Visit: Payer: BC Managed Care – PPO | Admitting: Family Medicine

## 2023-07-19 VITALS — BP 118/72 | HR 77 | Temp 98.0°F | Resp 16 | Ht 67.0 in | Wt 150.8 lb

## 2023-07-19 DIAGNOSIS — Z23 Encounter for immunization: Secondary | ICD-10-CM | POA: Diagnosis not present

## 2023-07-19 DIAGNOSIS — N3010 Interstitial cystitis (chronic) without hematuria: Secondary | ICD-10-CM | POA: Diagnosis not present

## 2023-07-19 MED ORDER — AMITRIPTYLINE HCL 10 MG PO TABS: 10.0000 mg | ORAL_TABLET | Freq: Every day | ORAL | 1 refills | Status: DC

## 2023-07-19 NOTE — Progress Notes (Signed)
Chief Complaint  Patient presents with   bladder    Discuss bladder problems    Subjective: Patient is a 42 y.o. female here for follow-up interstitial cystitis.  Patient has a history of interstitial cystitis dating back to 2018.  She used to be on a combination of hydroxyzine, nitrofurantoin, and Elmiron.  This was sent in by another provider in this office but the Elmiron was reported to be $1000.  She has never tried anything else.  She took that combination for a year before going into remission.  She was told to follow-up with the [facility] urology team if she ever had issues again but they are treating her like a new patient and now she has a 5-month wait to get in.  She has never been on amitriptyline.  Past Medical History:  Diagnosis Date   Endometriosis    Hypertension    Interstitial cystitis 2018   Dx'd w [facility]    Objective: BP 118/72 (BP Location: Left Arm, Patient Position: Sitting, Cuff Size: Normal)   Pulse 77   Temp 98 F (36.7 C) (Oral)   Resp 16   Ht 5\' 7"  (1.702 m)   Wt 150 lb 12.8 oz (68.4 kg)   LMP 05/20/2023   SpO2 98%   BMI 23.62 kg/m  General: Awake, appears stated age Lungs: No accessory muscle use Psych: Age appropriate judgment and insight, normal affect and mood  Assessment and Plan: Interstitial cystitis - Plan: amitriptyline (ELAVIL) 10 MG tablet  Need for influenza vaccination - Plan: Flu vaccine trivalent PF, 6mos and older(Flulaval,Afluria,Fluarix,Fluzone)  Chronic, uncontrolled.  Based on her deductible for prescriptions, we will hold off until 2025 for the Elmiron.  In the meanwhile we will start amitriptyline 10 mg nightly.  She will let me know if there are any issues.  She is on a waiting list with the urology team. The patient voiced understanding and agreement to the plan.  Jilda Roche [place], DO 07/19/23  8:54 AM

## 2023-07-19 NOTE — Patient Instructions (Signed)
Let me know if there are cost issues.  Hold on the Wellbutrin for now.   Send me a message in 2025 to refill the Elmiron.  Let us know if you need anything.

## 2023-08-05 ENCOUNTER — Other Ambulatory Visit: Payer: Self-pay | Admitting: Family Medicine

## 2023-08-12 ENCOUNTER — Other Ambulatory Visit: Payer: Self-pay | Admitting: Family Medicine

## 2023-08-12 ENCOUNTER — Other Ambulatory Visit: Payer: Self-pay | Admitting: Medical

## 2023-08-12 DIAGNOSIS — N3010 Interstitial cystitis (chronic) without hematuria: Secondary | ICD-10-CM

## 2023-09-27 ENCOUNTER — Encounter: Payer: Self-pay | Admitting: Family Medicine

## 2023-09-27 ENCOUNTER — Ambulatory Visit: Payer: 59 | Admitting: Family Medicine

## 2023-09-27 VITALS — BP 110/86 | HR 77 | Temp 98.1°F | Resp 16 | Ht 67.0 in | Wt 149.4 lb

## 2023-09-27 DIAGNOSIS — K5909 Other constipation: Secondary | ICD-10-CM

## 2023-09-27 DIAGNOSIS — G4700 Insomnia, unspecified: Secondary | ICD-10-CM | POA: Diagnosis not present

## 2023-09-27 DIAGNOSIS — R300 Dysuria: Secondary | ICD-10-CM

## 2023-09-27 LAB — POC URINALSYSI DIPSTICK (AUTOMATED)
Bilirubin, UA: NEGATIVE
Glucose, UA: NEGATIVE
Ketones, UA: NEGATIVE
Leukocytes, UA: NEGATIVE
Nitrite, UA: NEGATIVE
Protein, UA: NEGATIVE
Spec Grav, UA: <=1.005 — AB (ref 1.010–1.025)
Urobilinogen, UA: 0.2 U/dL
pH, UA: 6 (ref 5.0–8.0)

## 2023-09-27 MED ORDER — AMITRIPTYLINE HCL 25 MG PO TABS: 25.0000 mg | ORAL_TABLET | Freq: Every day | ORAL | 2 refills | Status: DC

## 2023-09-27 NOTE — Progress Notes (Signed)
Chief Complaint  Patient presents with   Abdominal Pain    Sxs started x2 days ago, pt states having burning, pressure, and urgency.     Rebekah Hanson is a 43 y.o. female here for possible UTI.  Duration: 2 days. Symptoms: Dysuria, urinary frequency, urinary retention, and abd pain, urgency Denies: hematuria, urinary hesitancy, fever, vomiting, and flank pain, vaginal discharge Hx of recurrent UTI? No-she does have a history of IC and follows with urology Denies new sexual partners.  Over the past several months she has felt poorly.  She reports poor sleep, constipation, chronic urinary issues, and abdominal pain.  She has had several laparoscopic procedures for endometriosis.  She is wondering if a multidisciplinary approach can take place without going out of network.  Her current gynecologist is through [facility].  She is taking Elavil 10 mg nightly.  Initially helped with sleep, unsure if it helped with GI symptoms.  She has been very diligent with fiber, hydration, magnesium, and laxatives.  Past Medical History:  Diagnosis Date   Endometriosis    Hypertension    Interstitial cystitis 2018   Dx'd w [facility]     BP 110/86 (BP Location: Left Arm, Patient Position: Sitting, Cuff Size: Normal)   Pulse 77   Temp 98.1 F (36.7 C) (Oral)   Resp 16   Ht 5\' 7"  (1.702 m)   Wt 149 lb 6.4 oz (67.8 kg)   SpO2 98%   BMI 23.40 kg/m  General: Awake, alert, appears stated age Heart: RRR Lungs: CTAB, normal respiratory effort, no accessory muscle usage Abd: BS+, soft, diffuse tenderness to palpation, ND, no masses or organomegaly MSK: No CVA tenderness, neg Lloyd's sign Psych: Age appropriate judgment and insight  Dysuria - Plan: POCT Urinalysis Dipstick (Automated), Urine Culture  Chronic constipation - Plan: amitriptyline (ELAVIL) 25 MG tablet  Insomnia, unspecified type - Plan: amitriptyline (ELAVIL) 25 MG tablet  Stay hydrated.  Check culture.  Seek immediate  care if pt starts to develop fevers, new/worsening symptoms, uncontrollable N/V. Stay hydrated, fiber intake recommended.  Increase Elavil from 10 mg daily to 25 mg daily. Chronic, uncontrolled.  Elavil increase as above.  Ideally would follow-up in 1 month. Regarding her endometriosis, I admitted I am not an expert in this field.  I would start with her gynecologist to see what is recommended with in network first.  If there is no reasonable answer, I would consider reaching out to [facility] urogynecology.  She has also researched centers in [place] and [place] which have leaders in the field caring for patients. The patient voiced understanding and agreement to the plan.  Jilda Roche [place], DO 09/27/23 5:14 PM

## 2023-09-27 NOTE — Patient Instructions (Addendum)
Stay hydrated.   Warning signs/symptoms: Uncontrollable nausea/vomiting, fevers, worsening symptoms despite treatment, confusion.  Give [place] around 2 business days to get culture back to you.  Let us know if you need anything.

## 2023-09-28 LAB — URINE CULTURE
MICRO NUMBER:: 16014201
Result:: NO GROWTH
SPECIMEN QUALITY:: ADEQUATE

## 2023-09-29 ENCOUNTER — Encounter: Payer: Self-pay | Admitting: Family Medicine

## 2023-10-19 ENCOUNTER — Other Ambulatory Visit: Payer: Self-pay | Admitting: Family Medicine

## 2023-10-19 DIAGNOSIS — G4700 Insomnia, unspecified: Secondary | ICD-10-CM

## 2023-10-19 DIAGNOSIS — K5909 Other constipation: Secondary | ICD-10-CM

## 2023-11-04 ENCOUNTER — Other Ambulatory Visit: Payer: Self-pay | Admitting: Family Medicine

## 2023-11-22 ENCOUNTER — Encounter: Payer: Self-pay | Admitting: Family Medicine

## 2024-01-30 ENCOUNTER — Other Ambulatory Visit: Payer: Self-pay

## 2024-01-30 ENCOUNTER — Other Ambulatory Visit: Payer: Self-pay | Admitting: Family Medicine

## 2024-01-30 DIAGNOSIS — K5909 Other constipation: Secondary | ICD-10-CM

## 2024-01-30 DIAGNOSIS — G4700 Insomnia, unspecified: Secondary | ICD-10-CM

## 2024-01-30 DIAGNOSIS — N3010 Interstitial cystitis (chronic) without hematuria: Secondary | ICD-10-CM

## 2024-01-30 MED ORDER — AMITRIPTYLINE HCL 25 MG PO TABS: 25.0000 mg | ORAL_TABLET | Freq: Every day | ORAL | 0 refills | Status: DC

## 2024-03-08 ENCOUNTER — Other Ambulatory Visit: Payer: Self-pay | Admitting: Family Medicine

## 2024-03-08 ENCOUNTER — Encounter: Payer: Self-pay | Admitting: Family Medicine

## 2024-03-08 MED ORDER — ONDANSETRON 4 MG PO TBDP: 4.0000 mg | ORAL_TABLET | Freq: Three times a day (TID) | ORAL | 0 refills | Status: DC | PRN

## 2024-03-08 MED ORDER — AZITHROMYCIN 500 MG PO TABS: ORAL_TABLET | ORAL | 0 refills | Status: DC

## 2024-03-08 MED ORDER — ALPRAZOLAM 0.5 MG PO TABS: 0.2500 mg | ORAL_TABLET | Freq: Every day | ORAL | Status: DC | PRN

## 2024-03-13 ENCOUNTER — Ambulatory Visit (INDEPENDENT_AMBULATORY_CARE_PROVIDER_SITE_OTHER): Admitting: Physician Assistant

## 2024-03-13 VITALS — BP 95/63 | HR 71 | Ht 67.0 in | Wt 154.0 lb

## 2024-03-13 DIAGNOSIS — H6123 Impacted cerumen, bilateral: Secondary | ICD-10-CM

## 2024-03-13 NOTE — Progress Notes (Signed)
 Established patient visit   Patient: Rebekah Hanson   DOB: September 30, 1980   43 y.o. Female  MRN: [id number] Visit Date: 03/13/2024  Today's healthcare provider: Manuelita Flatness, PA-C   Chief Complaint  Patient presents with   Ear Pain    Mostly left, a little in the right   Subjective     Pt reports ear pain bilaterally, L>R, sinus pressure started first a few weeks ago,. She has an upcoming trip and plane ride and wants to be sure she doesn't have an ear/sinus infection.  Medications: Outpatient Medications Prior to Visit  Medication Sig   ALPRAZolam  (XANAX ) 0.5 MG tablet Take 0.5-1 tablets (0.25-0.5 mg total) by mouth daily as needed for anxiety.   amitriptyline  (ELAVIL ) 25 MG tablet Take 1 tablet (25 mg total) by mouth at bedtime.   azithromycin  (ZITHROMAX ) 500 MG tablet Take 1 tab daily for 3 days in event of walking pneumonia or traveler's diarrhea.   fluconazole  (DIFLUCAN ) 150 MG tablet Take 1 tab, repeat in 72 hours if no improvement. (Patient not taking: Reported on 09/27/2023)   hydrOXYzine  (VISTARIL ) 25 MG capsule TAKE 1 CAPSULE BY MOUTH EVERYDAY AT BEDTIME   minoxidil  (LONITEN ) 2.5 MG tablet TAKE 1 TABLET BY MOUTH EVERY DAY   ondansetron  (ZOFRAN -ODT) 4 MG disintegrating tablet Take 1 tablet (4 mg total) by mouth every 8 (eight) hours as needed for nausea or vomiting.   oxybutynin  (DITROPAN ) 5 MG tablet 1 tab po q 8 hrs prn bladder pain or urinary frequency   pentosan polysulfate (ELMIRON ) 100 MG capsule Take 1 capsule (100 mg total) by mouth 3 (three) times daily.   valsartan  (DIOVAN ) 80 MG tablet TAKE 1 TABLET BY MOUTH EVERY DAY   No facility-administered medications prior to visit.    Review of Systems  Constitutional:  Negative for fatigue and fever.  HENT:  Positive for congestion and ear pain.   Respiratory:  Negative for cough and shortness of breath.   Cardiovascular:  Negative for chest pain and leg swelling.  Gastrointestinal:  Negative for  abdominal pain.  Neurological:  Negative for dizziness and headaches.       Objective    BP 95/63   Pulse 71   Ht 5' 7 (1.702 m)   Wt 154 lb (69.9 kg)   BMI 24.12 kg/m    Physical Exam Vitals reviewed.  Constitutional:      Appearance: She is not ill-appearing.  HENT:     Head: Normocephalic.     Right Ear: There is impacted cerumen.     Left Ear: There is impacted cerumen.     Ears:     Comments: Once wax cleared, b/l TM clear without fluid, erythema Eyes:     Conjunctiva/sclera: Conjunctivae normal.  Cardiovascular:     Rate and Rhythm: Normal rate.  Pulmonary:     Effort: Pulmonary effort is normal. No respiratory distress.  Neurological:     Mental Status: She is alert and oriented to person, place, and time.  Psychiatric:        Mood and Affect: Mood normal.        Behavior: Behavior normal.      No results found for any visits on 03/13/24.  Assessment & Plan    Bilateral impacted cerumen  B/l ears flushed. Pt tolerated well. R ear large amount of wax obtained. Needed to use curette L ear, large amount of wax obtained.   Pt felt improved . Cont antihistamines for nasal  congestion. Pt has a zpack for traveler's diarrhea-- advised if she develops worsening nasal congestion/sinus pressure/ear pain, that would be an appropriate antibiotic.  Return if symptoms worsen or fail to improve.       Manuelita Flatness, PA-C  [facility] [place] [phone] (phone) [phone] (fax)  [facility]

## 2024-03-28 ENCOUNTER — Other Ambulatory Visit: Payer: Self-pay | Admitting: Family Medicine

## 2024-04-02 ENCOUNTER — Ambulatory Visit: Payer: Self-pay

## 2024-04-02 ENCOUNTER — Ambulatory Visit (INDEPENDENT_AMBULATORY_CARE_PROVIDER_SITE_OTHER): Admitting: Internal Medicine

## 2024-04-02 ENCOUNTER — Ambulatory Visit (INDEPENDENT_AMBULATORY_CARE_PROVIDER_SITE_OTHER)

## 2024-04-02 ENCOUNTER — Encounter: Payer: Self-pay | Admitting: Internal Medicine

## 2024-04-02 VITALS — BP 102/68 | HR 72 | Temp 98.0°F | Ht 67.0 in | Wt 153.2 lb

## 2024-04-02 DIAGNOSIS — R079 Chest pain, unspecified: Secondary | ICD-10-CM | POA: Diagnosis not present

## 2024-04-02 DIAGNOSIS — I10 Essential (primary) hypertension: Secondary | ICD-10-CM | POA: Diagnosis not present

## 2024-04-02 DIAGNOSIS — R768 Other specified abnormal immunological findings in serum: Secondary | ICD-10-CM

## 2024-04-02 DIAGNOSIS — F411 Generalized anxiety disorder: Secondary | ICD-10-CM | POA: Diagnosis not present

## 2024-04-02 NOTE — Telephone Encounter (Signed)
 Patient scheduled at [facility], message to assistant to call patient if any cancellations with [facility] per patient request.

## 2024-04-02 NOTE — Assessment & Plan Note (Signed)
 With mild situational worsening today, declines need for change in tx for now

## 2024-04-02 NOTE — Assessment & Plan Note (Signed)
 Pt has pleuritic pain, but no rash, joint symptoms, or known anemia, ok to follow

## 2024-04-02 NOTE — Telephone Encounter (Signed)
 Pt on the way to see Dr. Norleen at [facility]

## 2024-04-02 NOTE — Patient Instructions (Signed)
 Please continue all other medications as before, including tylenol  as needed  Please have the pharmacy call with any other refills you may need.  Please continue your efforts at being more active, low cholesterol diet, and weight control  Please keep your appointments with your specialists as you may have planned  Please go to the XRAY Department in the first floor for the x-ray testing  You will be contacted by phone if any changes need to be made immediately.  Otherwise, you will receive a letter about your results with an explanation, but please check with MyChart first.

## 2024-04-02 NOTE — Progress Notes (Signed)
 Patient ID: Rebekah Hanson, female   DOB: [birth year], 43 y.o.   MRN: [id number]        Chief Complaint: follow up left chest pain, htn, pos ANA, anxiety       HPI:  Rebekah Hanson is a 43 y.o. female here with c/o acute onset recurrent  1 wk left mid anterior chest pain, sharp, pleuritic without radiation, n/v, palps, diaphoresis, or syncope.  Describes It also as gas like but gas x and prilosec did not helpl  Pain occurred 2 days after a gym session working the arms and chest, then spent a couple hours in the heat last wk lifting and moving paving stones outside the home.  Did not seem to improve with advil, but tylenol  and heating pad did help for a few hours.   Pt denies polydipsia, polyuria, or new focal neuro s/s.    Pt denies fever, wt loss, night sweats, loss of appetite, or other constitutional symptoms          Wt Readings from Last 3 Encounters:  04/02/24 153 lb 3.2 oz (69.5 kg)  03/13/24 154 lb (69.9 kg)  09/27/23 149 lb 6.4 oz (67.8 kg)   BP Readings from Last 3 Encounters:  04/02/24 102/68  03/13/24 95/63  09/27/23 110/86         Past Medical History:  Diagnosis Date   Endometriosis    Hypertension    Interstitial cystitis 2018   Dx'd w [facility]   Past Surgical History:  Procedure Laterality Date   ABDOMINAL SURGERY     CESAREAN SECTION  2020   KNEE SURGERY     TONSILLECTOMY      reports that she has never smoked. She does not have any smokeless tobacco history on file. She reports current alcohol use. No history on file for drug use. family history includes Autoimmune disease in her father; Cancer in her brother and father; Hypertension in her brother and mother; Rheum arthritis in her mother; Thymic aplasia in her father. Allergies  Allergen Reactions   Doxycycline Photosensitivity and Rash    Breaks out in rash in the sun Breaks out in rash in the sun    Amoxicillin Hives   Erythromycin Nausea And Vomiting   Other Other (See Comments) and Nausea  And Vomiting    Mussels/Oysters Mussels/Oysters    Current Outpatient Medications on File Prior to Visit  Medication Sig Dispense Refill   ALPRAZolam  (XANAX ) 0.5 MG tablet Take 0.5-1 tablets (0.25-0.5 mg total) by mouth daily as needed for anxiety. 30 tablet 01   hydrOXYzine  (VISTARIL ) 25 MG capsule TAKE 1 CAPSULE BY MOUTH EVERYDAY AT BEDTIME 90 capsule 1   minoxidil  (LONITEN ) 2.5 MG tablet TAKE 1 TABLET BY MOUTH EVERY DAY 90 tablet 1   oxybutynin  (DITROPAN ) 5 MG tablet 1 tab po q 8 hrs prn bladder pain or urinary frequency 90 tablet 0   pentosan polysulfate (ELMIRON ) 100 MG capsule Take 1 capsule (100 mg total) by mouth 3 (three) times daily. 90 capsule 0   valsartan  (DIOVAN ) 80 MG tablet TAKE 1 TABLET BY MOUTH EVERY DAY 90 tablet 3   No current facility-administered medications on file prior to visit.        ROS:  All others reviewed and negative.  Objective        PE:  BP 102/68   Pulse 72   Temp 98 F (36.7 C)   Ht 5' 7 (1.702 m)   Wt 153 lb 3.2 oz (  69.5 kg)   SpO2 99%   BMI 23.99 kg/m                 Constitutional: Pt appears in NAD               HENT: Head: NCAT.                Right Ear: External ear normal.                 Left Ear: External ear normal.                Eyes: . Pupils are equal, round, and reactive to light. Conjunctivae and EOM are normal               Nose: without d/c or deformity               Neck: Neck supple. Gross normal ROM               Cardiovascular: Normal rate and regular rhythm.                 Pulmonary/Chest: Effort normal and breath sounds without rales or wheezing.                Abd:  Soft, NT, ND, + BS, no organomegaly               Neurological: Pt is alert. At baseline orientation, motor grossly intact               Skin: Skin is warm. No rashes, no other new lesions, LE edema - none               Psychiatric: Pt behavior is normal without agitation but mod nervous  Micro: none  Cardiac tracings I have personally  interpreted today:  none  Pertinent Radiological findings (summarize): none   Lab Results  Component Value Date   WBC 6.4 12/16/2022   HGB 13.2 12/16/2022   HCT 38.6 12/16/2022   PLT 263.0 12/16/2022   GLUCOSE 75 05/11/2022   CHOL 170 05/11/2022   TRIG 66.0 05/11/2022   HDL 70.70 05/11/2022   LDLCALC 86 05/11/2022   ALT 12 05/11/2022   AST 17 05/11/2022   NA 138 05/11/2022   K 3.9 05/11/2022   CL 103 05/11/2022   CREATININE 0.77 05/11/2022   BUN 14 05/11/2022   CO2 26 05/11/2022   TSH 1.14 12/16/2022   Assessment/Plan:  Rebekah Hanson is a 43 y.o. White or Caucasian [1] female with  has a past medical history of Endometriosis, Hypertension, and Interstitial cystitis (2018).  Chest pain Atypical, etiology not certain but seems highly likely MSK related,  doubt cardiac, PE, pna or chf, pt declines ecg, but for cxr r/o underlying pulmonary; o/w ok to continue tylenol  prn  Essential hypertension BP Readings from Last 3 Encounters:  04/02/24 102/68  03/13/24 95/63  09/27/23 110/86   Stable, pt to continue medical treatment diovan  80 qd   Positive ANA (antinuclear antibody) Pt has pleuritic pain, but no rash, joint symptoms, or known anemia, ok to follow  GAD (generalized anxiety disorder) With mild situational worsening today, declines need for change in tx for now  Followup: Return if symptoms worsen or fail to improve.  Lynwood Rush, MD 04/02/2024 7:01 PM [facility]

## 2024-04-02 NOTE — Assessment & Plan Note (Signed)
 Atypical, etiology not certain but seems highly likely MSK related,  doubt cardiac, PE, pna or chf, pt declines ecg, but for cxr r/o underlying pulmonary; o/w ok to continue tylenol  prn

## 2024-04-02 NOTE — Telephone Encounter (Signed)
  FYI Only or Action Required?: FYI only for provider.  Patient was last seen in primary care on 09/27/2023 by Frann Mabel Mt, DO.  Called Nurse Triage reporting Abdominal Pain.  Symptoms began 3 days ago .  Interventions attempted: OTC medications: Mylanta, Gas X.  Symptoms are: unchanged.  Triage Disposition: No disposition on file.  Patient/caregiver understands and will follow disposition?:  Discussed disposition with [facility]. Seeking advice whether to send to ED or OV. Pt not having any radiating pain and/or difficulty breathing. Discussed with pt that would like to send her to ED. Called CAL and was advised an OV would be ok to do and if a problem send downstairs to ED. No appts available. Called Jade who advised will offer appt at another location.             Reason for Disposition . [1] Chest pain lasts > 5 minutes AND [2] age > 30 AND [3] one or more cardiac risk factors (e.g., diabetes, high blood pressure, high cholesterol, obesity with BMI 30 or higher, smoker, or strong family history of heart disease)    HTN  Answer Assessment - Initial Assessment Questions 1. LOCATION: Where does it hurt?       Left chest  2. RADIATION: Does the pain go anywhere else? (e.g., into neck, jaw, arms, back)     no 3. ONSET: When did the chest pain begin? (Minutes, hours or days)      3 days ago  4. PATTERN: Does the pain come and go, or has it been constant since it started?  Does it get worse with exertion?      Constant dull ache //occasionally  6. SEVERITY: How bad is the pain?  (e.g., Scale 1-10; mild, moderate, or severe)     5/10  7. CARDIAC RISK FACTORS: Do you have any history of heart problems or risk factors for heart disease? (e.g., angina, prior heart attack; diabetes, high blood pressure, high cholesterol, smoker, or strong family history of heart disease)     HTN 9. CAUSE: What do you think is causing the chest pain?     Gas - has had it  before and went away with antacid 10. OTHER SYMPTOMS: Do you have any other symptoms? (e.g., dizziness, nausea, vomiting, sweating, fever, difficulty breathing, cough)       no  Protocols used: Chest Pain-A-AH

## 2024-04-02 NOTE — Assessment & Plan Note (Signed)
 BP Readings from Last 3 Encounters:  04/02/24 102/68  03/13/24 95/63  09/27/23 110/86   Stable, pt to continue medical treatment diovan  80 qd

## 2024-04-05 ENCOUNTER — Ambulatory Visit: Admitting: Family Medicine

## 2024-04-10 ENCOUNTER — Ambulatory Visit: Payer: Self-pay | Admitting: Internal Medicine

## 2024-05-06 ENCOUNTER — Ambulatory Visit: Admitting: Family Medicine

## 2024-05-06 ENCOUNTER — Encounter: Payer: Self-pay | Admitting: Family Medicine

## 2024-05-06 VITALS — BP 110/68 | HR 100 | Temp 98.0°F | Resp 16 | Ht 67.0 in | Wt 155.0 lb

## 2024-05-06 DIAGNOSIS — G5702 Lesion of sciatic nerve, left lower limb: Secondary | ICD-10-CM

## 2024-05-06 DIAGNOSIS — Z23 Encounter for immunization: Secondary | ICD-10-CM | POA: Diagnosis not present

## 2024-05-06 MED ORDER — MELOXICAM 15 MG PO TABS: 15.0000 mg | ORAL_TABLET | Freq: Every day | ORAL | 0 refills | Status: AC

## 2024-05-06 NOTE — Progress Notes (Signed)
 Musculoskeletal Exam  Patient: Rebekah Hanson DOB: 1980-10-18  DOS: 05/06/2024  SUBJECTIVE:  Chief Complaint:   Chief Complaint  Patient presents with   Back Pain    Back Pain    Rebekah Hanson is a 43 y.o.  female for evaluation and treatment of back pain.   Onset:  1 month ago.  She was squatting and was moving up on weight.  Location: lower L Character:  aching and dull  Progression of issue:  seems to get better but certain movements will flare things up Associated symptoms: Hx of piriformis syndrome No bruising, redness, swelling Radiating down LLE Denies bowel/bladder incontinence or weakness Treatment: to date has been rest and massage.   Neurovascular symptoms: no  Past Medical History:  Diagnosis Date   Endometriosis    Hypertension    Interstitial cystitis 2018   Dx'd w [facility]    Objective:  VITAL SIGNS: BP 110/68 (BP Location: Left Arm, Patient Position: Sitting)   Pulse 100   Temp 98 F (36.7 C) (Oral)   Resp 16   Ht 5' 7 (1.702 m)   Wt 155 lb (70.3 kg)   SpO2 98%   BMI 24.28 kg/m  Constitutional: Well formed, well developed. No acute distress. HENT: Normocephalic, atraumatic.  Thorax & Lungs:  No accessory muscle use Musculoskeletal: low back.   Tenderness to palpation: yes over L piriformis Deformity: no Ecchymosis: no Straight leg test: negative for Poor hamstring flexibility b/l. Neurologic: Normal sensory function. No focal deficits noted. DTR's equal and symmetric in LE's. No clonus. Psychiatric: Normal mood. Age appropriate judgment and insight. Alert & oriented x 3.    Assessment:  Piriformis syndrome of left side - Plan: meloxicam  (MOBIC ) 15 MG tablet  Need for influenza vaccination - Plan: Flu vaccine trivalent PF, 6mos and older(Flulaval,Afluria,Fluarix,Fluzone)  Plan: Stretches/exercises, heat, ice, Tylenol , NSAIDs. Has appt w Dr. Cleatrice. I do trust his judgment, he may want to do a trigger pt injection over  piriformis.  F/u prn. The patient voiced understanding and agreement to the plan.   Mabel Mt [place], DO 05/06/24  1:44 PM

## 2024-05-06 NOTE — Patient Instructions (Addendum)
 Heat (pad or rice pillow in microwave) over affected area, 10-15 minutes twice daily.   Ice/cold pack over area for 10-15 min twice daily.  OK to take Tylenol  1000 mg (2 extra strength tabs) or 975 mg (3 regular strength tabs) every 6 hours as needed.  Take the meloxicam  daily for the next 10 days.   Let us  know if you need anything.  Piriformis Syndrome Rehab It is normal to feel mild stretching, pulling, tightness, or discomfort as you do these exercises, but you should stop right away if you feel sudden pain or your pain gets worse.   Stretching and range of motion exercises These exercises warm up your muscles and joints and improve the movement and flexibility of your hip and pelvis. These exercises also help to relieve pain, numbness, and tingling. Exercise A: Hip rotators    Lie on your back on a firm surface. Pull your left / right knee toward your same shoulder with your left / right hand until your knee is pointing toward the ceiling. Hold your left / right ankle with your other hand. Keeping your knee steady, gently pull your left / right ankle toward your other shoulder until you feel a stretch in your buttocks. Hold this position for 30 seconds. Repeat 2 times. Complete this stretch 3 times per week. Exercise B: Hip extensors Lie on your back on a firm surface. Both of your legs should be straight. Pull your left / right knee to your chest. Hold your leg in this position by holding onto the back of your thigh or the front of your knee. Hold this position for 30 seconds. Slowly return to the starting position. Repeat 2 times. Complete this stretch 3 times per week.  Strengthening exercises These exercises build strength and endurance in your hip and thigh muscles. Endurance is the ability to use your muscles for a long time, even after they get tired. Exercise C: Straight leg raises (hip abductors)     Lie on your side with your left / right leg in the top position. Lie  so your head, shoulder, knee, and hip line up. Bend your bottom knee to help you balance. Lift your top leg up 4-6 inches (10-15 cm), keeping your toes pointed straight ahead. Hold this position for 1 second. Slowly lower your leg to the starting position. Let your muscles relax completely. Repeat for a total of 10 repetitions. Repeat 2 times. Complete this exercise 3 times per week. Exercise D: Hip abductors and rotators, quadruped    Get on your hands and knees on a firm, lightly padded surface. Your hands should be directly below your shoulders, and your knees should be directly below your hips. Lift your left / right knee out to the side. Keep your knee bent. Do not twist your body. Hold this position for 1 seconds. Slowly lower your leg. Repeat for a total of 10 repetitions.  Repeat 1 times. Complete this exercise 3 times per week. Exercise E: Straight leg raises (hip extensors) Lie on your abdomen on a bed or a firm surface with a pillow under your hips. Squeeze your buttock muscles and lift your left / right thigh off the bed. Do not let your back arch. Hold this position for 3 seconds. Slowly return to the starting position. Let your muscles relax completely before doing another repetition. Repeat 2 times. Complete this exercise 3 times per week.  This information is not intended to replace advice given to you by your  health care provider. Make sure you discuss any questions you have with your health care provider. Document Released: 08/15/2005 Document Revised: 04/19/2016 Document Reviewed: 07/28/2015 Elsevier Interactive Patient Education  [employer].

## 2024-05-08 ENCOUNTER — Ambulatory Visit: Admitting: Family Medicine

## 2024-05-08 ENCOUNTER — Encounter: Payer: Self-pay | Admitting: Family Medicine

## 2024-05-08 ENCOUNTER — Other Ambulatory Visit: Payer: Self-pay

## 2024-05-08 VITALS — BP 116/70 | Ht 67.0 in | Wt 152.0 lb

## 2024-05-08 DIAGNOSIS — M25552 Pain in left hip: Secondary | ICD-10-CM | POA: Diagnosis not present

## 2024-05-08 NOTE — Progress Notes (Cosign Needed)
 PCP: Frann Mabel Mt, DO  Subjective:   HPI: Patient is a 43 y.o. female here for acute on chronic left hip and buttock pain.  She endorses an extensive and complicated history of pain in her left hip and buttock with shooting pains down into her left foot for the last 11-12 years since giving birth to twins.  She has seen numerous providers from neurosurgery, orthopedics, and sports medicine.  Ultimately, she states that she was told she had piriformis syndrome.  The only treatment that has given her sustained pain relief is a corticosteroid injection of the piriformis.  This was completed 6-7 years ago.  She has had flares of pain since that time that are managed with physical therapy.  Most recently, she attempted to increase her weight with squatting, which triggered a flare.  She has been going to physical therapy but states that pain has not improved in terms of severity like it typically does.  She is also using a Theragun regularly, sitting on a spike ball at work, and sleeping on a heating pad.  She is chiefly concerned about shooting pain into her left foot.  She has prescribed gabapentin in the setting of endometriosis and states that she tried taking it recently with moderate relief of neuropathic pain.  She would like to discuss repeat piriformis injection.  Past Medical History:  Diagnosis Date   Endometriosis    Hypertension    Interstitial cystitis 2018   Dx'd w [facility]    Current Outpatient Medications on File Prior to Visit  Medication Sig Dispense Refill   ALPRAZolam  (XANAX ) 0.5 MG tablet Take 0.5-1 tablets (0.25-0.5 mg total) by mouth daily as needed for anxiety. 30 tablet 01   hydrOXYzine  (VISTARIL ) 25 MG capsule TAKE 1 CAPSULE BY MOUTH EVERYDAY AT BEDTIME 90 capsule 1   meloxicam  (MOBIC ) 15 MG tablet Take 1 tablet (15 mg total) by mouth daily. 21 tablet 0   minoxidil  (LONITEN ) 2.5 MG tablet TAKE 1 TABLET BY MOUTH EVERY DAY 90 tablet 1   oxybutynin  (DITROPAN ) 5 MG  tablet 1 tab po q 8 hrs prn bladder pain or urinary frequency 90 tablet 0   pentosan polysulfate (ELMIRON ) 100 MG capsule Take 1 capsule (100 mg total) by mouth 3 (three) times daily. 90 capsule 0   valsartan  (DIOVAN ) 80 MG tablet TAKE 1 TABLET BY MOUTH EVERY DAY 90 tablet 3   No current facility-administered medications on file prior to visit.    Past Surgical History:  Procedure Laterality Date   ABDOMINAL SURGERY     CESAREAN SECTION  2020   KNEE SURGERY     TONSILLECTOMY      Allergies  Allergen Reactions   Doxycycline Photosensitivity and Rash    Breaks out in rash in the sun Breaks out in rash in the sun    Amoxicillin Hives   Erythromycin Nausea And Vomiting   Other Other (See Comments) and Nausea And Vomiting    Mussels/Oysters Mussels/Oysters     BP 116/70   Ht 5' 7 (1.702 m)   Wt 152 lb (68.9 kg)   BMI 23.81 kg/m       No data to display              No data to display              Objective:  Physical Exam:  Gen: NAD, comfortable in exam room  Left hip/buttock No obvious deformity on inspection TTP over the left buttock  at the expected site of piriformis FROM of the left hip Pain is elicited with resisted external rotation and abduction Positive FAIR and piriformis stretch Positive SLR on the left The left lower extremity is grossly NV intact    Assessment & Plan:  1.  Acute on chronic left hip and buttock pain Patient has a complicated and extensive history of left buttock pain with sciatica.  MRIs of the left hip and lumbar spine has overall been unremarkable and do not show pathology to explain her symptoms.  She has exhausted conservative treatment measures and states that a piriformis injection 6-7 years ago is the only thing that has provided any sustained pain relief.  Pain had largely been absent until she tried increasing weight with squatting 1 month ago.  Treatment options reviewed today.  Ultimately, the patient elected to  proceed with repeat steroid injection of piriformis.  She understands that this may provide temporary pain relief but is not a long-term solution.  Physical therapy remains the mainstay of treatment for piriformis syndrome.  She should continue physical therapy, focusing on hip abduction and external rotation.  Recommend avoiding activities that exacerbate pain such as squats, lunges, hills, and stairs.  Recommend continuing additional conservative treatment measures.  At this point she can follow-up on an as-needed basis.  Consider nerve conduction studies if there is no improvement with today's injection.  Procedure Note: Left Piriformis Injection After informed written consent, timeout was performed, patient was placed in a prone position on the exam table. The left hip and buttock were prepped with chlorhexidine. The site of insertion of piriformis at the super aspect of the greater trochanter was identified with ultrasound and utilizing lateral-medial approach, the patient's piriformis was injected with 3:1 lidocaine: depomedrol. Patient tolerated the procedure well without immediate complications.

## 2024-05-08 NOTE — Patient Instructions (Signed)
 You have piriformis syndrome but it's possible a nerve upstream from this is being irritated causing the constant and recurrent issues you're having. Try to avoid painful activities when possible (squats, lunges, hills, stairs) Pick 2-3 stretches where you feel the pull in the area of pain - do 3 of these and hold for 20-30 seconds twice a day. Hip abduction and external rotation strengthening. Aleve and/or tylenol  as needed. Tennis ball to massage area when sitting. Follow up with me as needed. Consider nerve conduction studies but these are usually normal.

## 2024-05-09 ENCOUNTER — Encounter: Payer: Self-pay | Admitting: Family Medicine

## 2024-06-14 ENCOUNTER — Telehealth: Admitting: Physician Assistant

## 2024-06-14 DIAGNOSIS — J020 Streptococcal pharyngitis: Secondary | ICD-10-CM | POA: Diagnosis not present

## 2024-06-14 MED ORDER — AZITHROMYCIN 250 MG PO TABS: ORAL_TABLET | ORAL | 0 refills | Status: AC

## 2024-06-14 NOTE — Progress Notes (Signed)

## 2024-09-03 ENCOUNTER — Encounter: Payer: Self-pay | Admitting: Family Medicine

## 2024-09-03 ENCOUNTER — Other Ambulatory Visit: Payer: Self-pay | Admitting: Family Medicine

## 2024-09-03 MED ORDER — ALPRAZOLAM 0.5 MG PO TABS: 0.2500 mg | ORAL_TABLET | Freq: Every day | ORAL | Status: AC | PRN

## 2024-09-10 ENCOUNTER — Ambulatory Visit: Payer: Self-pay | Admitting: Family Medicine

## 2024-09-10 ENCOUNTER — Ambulatory Visit: Admitting: Family Medicine

## 2024-09-10 ENCOUNTER — Encounter: Payer: Self-pay | Admitting: Family Medicine

## 2024-09-10 VITALS — BP 118/72 | HR 93 | Temp 99.0°F | Resp 16 | Ht 67.0 in | Wt 155.6 lb

## 2024-09-10 DIAGNOSIS — F411 Generalized anxiety disorder: Secondary | ICD-10-CM | POA: Diagnosis not present

## 2024-09-10 DIAGNOSIS — Z1211 Encounter for screening for malignant neoplasm of colon: Secondary | ICD-10-CM

## 2024-09-10 DIAGNOSIS — N951 Menopausal and female climacteric states: Secondary | ICD-10-CM | POA: Insufficient documentation

## 2024-09-10 DIAGNOSIS — Z0000 Encounter for general adult medical examination without abnormal findings: Secondary | ICD-10-CM

## 2024-09-10 LAB — COMPREHENSIVE METABOLIC PANEL WITH GFR
ALT: 14 U/L (ref 3–35)
AST: 16 U/L (ref 5–37)
Albumin: 4.8 g/dL (ref 3.5–5.2)
Alkaline Phosphatase: 45 U/L (ref 39–117)
BUN: 23 mg/dL (ref 6–23)
CO2: 24 meq/L (ref 19–32)
Calcium: 9.6 mg/dL (ref 8.4–10.5)
Chloride: 105 meq/L (ref 96–112)
Creatinine, Ser: 0.77 mg/dL (ref 0.40–1.20)
GFR: 93.41 mL/min
Glucose, Bld: 85 mg/dL (ref 70–99)
Potassium: 4.2 meq/L (ref 3.5–5.1)
Sodium: 138 meq/L (ref 135–145)
Total Bilirubin: 0.4 mg/dL (ref 0.2–1.2)
Total Protein: 7.2 g/dL (ref 6.0–8.3)

## 2024-09-10 LAB — LIPID PANEL
Cholesterol: 189 mg/dL (ref 28–200)
HDL: 65 mg/dL
LDL Cholesterol: 105 mg/dL — ABNORMAL HIGH (ref 10–99)
NonHDL: 124.01
Total CHOL/HDL Ratio: 3
Triglycerides: 96 mg/dL (ref 10.0–149.0)
VLDL: 19.2 mg/dL (ref 0.0–40.0)

## 2024-09-10 LAB — CBC
HCT: 39.1 % (ref 36.0–46.0)
Hemoglobin: 13.4 g/dL (ref 12.0–15.0)
MCHC: 34.3 g/dL (ref 30.0–36.0)
MCV: 93.1 fl (ref 78.0–100.0)
Platelets: 267 K/uL (ref 150.0–400.0)
RBC: 4.2 Mil/uL (ref 3.87–5.11)
RDW: 13 % (ref 11.5–15.5)
WBC: 4.9 K/uL (ref 4.0–10.5)

## 2024-09-10 MED ORDER — PROGESTERONE MICRONIZED 100 MG PO CAPS: 100.0000 mg | ORAL_CAPSULE | Freq: Every day | ORAL | 0 refills | Status: AC

## 2024-09-10 MED ORDER — PROPRANOLOL HCL 10 MG PO TABS: ORAL_TABLET | ORAL | 1 refills | Status: AC

## 2024-09-10 NOTE — Patient Instructions (Signed)
 Give [place] 2-3 business days to get the results of your labs back.  ? ?Keep the diet clean and stay active. ? ?Please get me a copy of your advanced directive form at your convenience.  ? ?If you do not hear anything about your referral in the next 1-2 weeks, call our office and ask for an update. ? ?Let us know if you need anything. ?

## 2024-09-10 NOTE — Progress Notes (Signed)
 Chief Complaint  Patient presents with   Annual Exam    CPE     Well Woman Rebekah Hanson is here for a complete physical.   Her last physical was >1 year ago.  Current diet: in general, a healthy diet. Current exercise: walking, lifting wts. Weight is stable and she denies fatigue out of ordinary. Seatbelt? Yes Advanced directive? Yes  Health Maintenance Pap/HPV- Yes Mammogram- Due Tetanus- Yes Hep C screening- Yes HIV screening- Yes  Patient has a history of endometriosis.  She is starting to go through menopause.  She has failed norethindrone due to side effects.  She is interested in hormonal therapy but does not want a estrogen.  She wants to take Prometrium .  Her gynecologist offered a hysterectomy.  Patient has been waking up in the low night in a panic.  She does snore softly but not routinely.  She does not wake up gasping for air.  She has been taking a Xanax  as she is anxious.  No excessive stress in her life otherwise.  Past Medical History:  Diagnosis Date   Endometriosis    Hypertension    Interstitial cystitis 2018   Dx'd w [facility]     Past Surgical History:  Procedure Laterality Date   ABDOMINAL SURGERY     CESAREAN SECTION  2020   KNEE SURGERY     TONSILLECTOMY      Medications  Medications Ordered Prior to Encounter[1]   Allergies Allergies[2]  Review of Systems: Constitutional:  no unexpected weight changes Eye:  no recent significant change in vision Ear/Nose/Mouth/Throat:  Ears:  no recent change in hearing Nose/Mouth/Throat:  no complaints of nasal congestion, no sore throat Cardiovascular: no chest pain Respiratory:  no shortness of breath Gastrointestinal:  no new abdominal pain, no change in bowel habits GU:  Female: negative for dysuria or new pelvic pain Musculoskeletal/Extremities:  no pain of the joints Integumentary (Skin/Breast):  no abnormal skin lesions reported Neurologic:  no headaches Endocrine:  denies  fatigue Hematologic/Lymphatic:  No areas of easy bleeding  Exam BP 118/72 (BP Location: Left Arm, Patient Position: Sitting)   Pulse 93   Temp 99 F (37.2 C) (Oral)   Resp 16   Ht 5' 7 (1.702 m)   Wt 155 lb 9.6 oz (70.6 kg)   SpO2 100%   BMI 24.37 kg/m  General:  well developed, well nourished, in no apparent distress Skin:  no significant moles, warts, or growths Head:  no masses, lesions, or tenderness Eyes:  pupils equal and round, sclera anicteric without injection Ears:  canals without lesions, TMs shiny without retraction, no obvious effusion, no erythema Nose:  nares patent, mucosa normal, and no drainage Throat/Pharynx:  lips and gingiva without lesion; tongue and uvula midline; non-inflamed pharynx; no exudates or postnasal drainage Neck: neck supple without adenopathy, thyromegaly, or masses Lungs:  clear to auscultation, breath sounds equal bilaterally, no respiratory distress Cardio:  regular rate and rhythm, no LE edema Abdomen:  abdomen soft, +TTP in the lower quadrants; bowel sounds normal; no masses or organomegaly Genital: Defer to GYN Musculoskeletal:  symmetrical muscle groups noted without atrophy or deformity Extremities:  no clubbing, cyanosis, or edema, no deformities, no skin discoloration Neuro:  gait normal; deep tendon reflexes normal and symmetric Psych: well oriented with normal range of affect and appropriate judgment/insight  Assessment and Plan  Well adult exam - Plan: CBC, Comprehensive metabolic panel with GFR, Lipid panel, Hepatitis B surface antibody,quantitative  Screen for colon  cancer - Plan: Ambulatory referral to Gastroenterology, CANCELED: Ambulatory referral to Gastroenterology  Perimenopause - Plan: progesterone  (PROMETRIUM ) 100 MG capsule  GAD (generalized anxiety disorder) - Plan: propranolol  (INDERAL ) 10 MG tablet   Well 44 y.o. female. Counseled on diet and exercise. Advanced directive form provided today.  Up-to-date with  mammogram, follows with the GYN team. Colon cancer screening: Refer to GI. Other orders as above. Perimenopause: chronic, not controlled. Will trial Prometrium  100 mg/d. F/u in 1 mo.  GAD: Chronic, uncontrolled. Cont Xana prn, but will add non-habit forming propranolol  to try as an alt. F/u in 1 mo.  The patient voiced understanding and agreement to the plan.  Mabel [place], DO 09/10/2024 4:45 PM     [1]  Current Outpatient Medications on File Prior to Visit  Medication Sig Dispense Refill   ALPRAZolam  (XANAX ) 0.5 MG tablet Take 0.5-1 tablets (0.25-0.5 mg total) by mouth daily as needed for anxiety. 30 tablet 01   hydrOXYzine  (VISTARIL ) 25 MG capsule TAKE 1 CAPSULE BY MOUTH EVERYDAY AT BEDTIME 90 capsule 1   meloxicam  (MOBIC ) 15 MG tablet Take 1 tablet (15 mg total) by mouth daily. 21 tablet 0   minoxidil  (LONITEN ) 2.5 MG tablet TAKE 1 TABLET BY MOUTH EVERY DAY 90 tablet 1   oxybutynin  (DITROPAN ) 5 MG tablet 1 tab po q 8 hrs prn bladder pain or urinary frequency 90 tablet 0   pentosan polysulfate (ELMIRON ) 100 MG capsule Take 1 capsule (100 mg total) by mouth 3 (three) times daily. 90 capsule 0   valsartan  (DIOVAN ) 80 MG tablet TAKE 1 TABLET BY MOUTH EVERY DAY 90 tablet 3   No current facility-administered medications on file prior to visit.  [2]  Allergies Allergen Reactions   Doxycycline Photosensitivity and Rash    Breaks out in rash in the sun Breaks out in rash in the sun    Amoxicillin Hives   Erythromycin Nausea And Vomiting   Other Other (See Comments) and Nausea And Vomiting    Mussels/Oysters Mussels/Oysters

## 2024-09-11 LAB — HEPATITIS B SURFACE ANTIBODY, QUANTITATIVE: Hep B S AB Quant (Post): <5 m[IU]/mL — ABNORMAL LOW

## 2025-03-10 ENCOUNTER — Ambulatory Visit: Admitting: Family Medicine
